# Patient Record
Sex: Female | Born: 1940 | Race: Black or African American | Hispanic: No | Marital: Single | Smoking: Never smoker
Health system: Southern US, Community
[De-identification: ages and names within clinical notes are randomized; demographics above are authoritative.]

## PROBLEM LIST (undated history)

## (undated) DIAGNOSIS — E7800 Pure hypercholesterolemia, unspecified: Secondary | ICD-10-CM

## (undated) DIAGNOSIS — Z9109 Other allergy status, other than to drugs and biological substances: Secondary | ICD-10-CM

## (undated) DIAGNOSIS — I10 Essential (primary) hypertension: Secondary | ICD-10-CM

## (undated) HISTORY — PX: TONSILLECTOMY: SUR1361

## (undated) HISTORY — PX: ABDOMINAL HYSTERECTOMY: SHX81

---

## 1998-08-30 ENCOUNTER — Other Ambulatory Visit: Admission: RE | Admit: 1998-08-30 | Discharge: 1998-08-30 | Payer: Self-pay | Admitting: Obstetrics & Gynecology

## 1999-09-08 ENCOUNTER — Other Ambulatory Visit: Admission: RE | Admit: 1999-09-08 | Discharge: 1999-09-08 | Payer: Self-pay | Admitting: Obstetrics & Gynecology

## 2002-04-18 ENCOUNTER — Encounter: Payer: Self-pay | Admitting: Internal Medicine

## 2002-04-18 ENCOUNTER — Encounter: Admission: RE | Admit: 2002-04-18 | Discharge: 2002-04-18 | Payer: Self-pay | Admitting: Internal Medicine

## 2002-04-24 ENCOUNTER — Encounter: Payer: Self-pay | Admitting: Internal Medicine

## 2002-04-24 ENCOUNTER — Encounter: Admission: RE | Admit: 2002-04-24 | Discharge: 2002-04-24 | Payer: Self-pay | Admitting: Internal Medicine

## 2002-04-25 ENCOUNTER — Encounter: Payer: Self-pay | Admitting: Internal Medicine

## 2002-04-25 ENCOUNTER — Encounter: Admission: RE | Admit: 2002-04-25 | Discharge: 2002-04-25 | Payer: Self-pay | Admitting: Internal Medicine

## 2002-05-21 ENCOUNTER — Encounter: Payer: Self-pay | Admitting: Internal Medicine

## 2002-05-21 ENCOUNTER — Ambulatory Visit (HOSPITAL_COMMUNITY): Admission: RE | Admit: 2002-05-21 | Discharge: 2002-05-21 | Payer: Self-pay | Admitting: Internal Medicine

## 2002-05-26 ENCOUNTER — Ambulatory Visit (HOSPITAL_COMMUNITY): Admission: RE | Admit: 2002-05-26 | Discharge: 2002-05-26 | Payer: Self-pay | Admitting: Gastroenterology

## 2002-05-26 ENCOUNTER — Encounter (INDEPENDENT_AMBULATORY_CARE_PROVIDER_SITE_OTHER): Payer: Self-pay

## 2002-05-29 ENCOUNTER — Ambulatory Visit (HOSPITAL_COMMUNITY): Admission: RE | Admit: 2002-05-29 | Discharge: 2002-05-29 | Payer: Self-pay | Admitting: Gastroenterology

## 2002-10-17 ENCOUNTER — Encounter: Admission: RE | Admit: 2002-10-17 | Discharge: 2002-10-17 | Payer: Self-pay | Admitting: Occupational Medicine

## 2002-10-17 ENCOUNTER — Encounter: Payer: Self-pay | Admitting: Occupational Medicine

## 2004-09-26 ENCOUNTER — Other Ambulatory Visit: Admission: RE | Admit: 2004-09-26 | Discharge: 2004-09-26 | Payer: Self-pay | Admitting: Obstetrics & Gynecology

## 2008-11-16 ENCOUNTER — Emergency Department (HOSPITAL_COMMUNITY): Admission: EM | Admit: 2008-11-16 | Discharge: 2008-11-16 | Payer: Self-pay | Admitting: Emergency Medicine

## 2008-12-09 ENCOUNTER — Encounter: Admission: RE | Admit: 2008-12-09 | Discharge: 2009-02-10 | Payer: Self-pay | Admitting: Internal Medicine

## 2009-06-07 ENCOUNTER — Encounter: Admission: RE | Admit: 2009-06-07 | Discharge: 2009-06-07 | Payer: Self-pay | Admitting: Internal Medicine

## 2009-06-14 ENCOUNTER — Encounter: Admission: RE | Admit: 2009-06-14 | Discharge: 2009-06-14 | Payer: Self-pay | Admitting: Obstetrics & Gynecology

## 2009-10-22 ENCOUNTER — Encounter: Admission: RE | Admit: 2009-10-22 | Discharge: 2009-10-22 | Payer: Self-pay | Admitting: Obstetrics & Gynecology

## 2010-07-22 NOTE — Op Note (Signed)
   NAME:  Deborah Stein, Deborah Stein                          ACCOUNT NO.:  [id number]   MEDICAL RECORD NO.:  [id number]                   PATIENT TYPE:  AMB   LOCATION:  ENDO                                 FACILITY:  [facility]   PHYSICIAN:  Danise Edge, M.D.                DATE OF BIRTH:  01/05/1941   DATE OF PROCEDURE:  05/26/2002  DATE OF DISCHARGE:                                 OPERATIVE REPORT   REFERRING PHYSICIAN:  Lilla Shook, M.D.   PROCEDURE:  Screening colonoscopy.   PROCEDURE INDICATION:  Ms. Deborah Stein is a 70 year old female, born  05-15-1940.  Ms. Deborah Stein is scheduled to undergo her first screening  colonoscopy with polypectomy to prevent colon cancer.  I discussed with her  the complications associated with colonoscopy and polypectomy including a 15  per 1000 risk of bleeding and 1 per 1000 risk of colon perforation requiring  surgery.  Ms. Deborah Stein has signed the operative permit.   ENDOSCOPIST:  Charolett Bumpers, M.D.   PREMEDICATION:  1. Demerol 50 mg.  2. Versed 5 mg.   ENDOSCOPE:  Olympus pediatric adjustable video colonoscope.   DESCRIPTION OF PROCEDURE:  After obtaining informed consent, Ms. Deborah Stein was  placed in the left lateral decubitus position.  I administered intravenous  Demerol and intravenous Versed to achieve conscious sedation for the  procedure.  The patient's blood pressure, oxygen saturation, and cardiac  rhythm were monitored throughout the procedure and documented in the medical  record.   Anal inspection was normal.  Digital rectal exam was normal.  The Olympus  pediatric colonoscope was introduced into the rectum and easily advanced to  the cecum.  Colonic preparation for the exam today was excellent.   RECTUM:  From the distal rectum, a 1 mm sessile polyp was removed with the  hot biopsy forceps.  SIGMOID COLON AND DESCENDING COLON:  Normal.  SPLENIC FLEXURE:  Normal.  TRANSVERSE COLON:  Normal.  HEPATIC FLEXURE:  Normal.  ASCENDING COLON:  Normal.  CECUM AND ILEOCECAL VALVE:  Normal.   ASSESSMENT:  From the distal rectum, a 1 mm sessile polyp was removed with  the hot biopsy forceps; otherwise normal screening proctocolonoscopy to the  cecum.    RECOMMENDATIONS:  If rectal polyp returns neoplastic pathologically, Ms.  Deborah Stein should undergo a repeat colonoscopy in five years.                                               Danise Edge, M.D.    MJ/MEDQ  D:  05/26/2002  T:  05/26/2002  Job:  [id number]   cc:   Lilla Shook, M.D.  [address], [address]  [place]  [place]  [address]  Fax: [phone]

## 2010-08-23 ENCOUNTER — Other Ambulatory Visit: Payer: Self-pay | Admitting: Internal Medicine

## 2010-08-23 DIAGNOSIS — Z1231 Encounter for screening mammogram for malignant neoplasm of breast: Secondary | ICD-10-CM

## 2010-10-26 ENCOUNTER — Ambulatory Visit
Admission: RE | Admit: 2010-10-26 | Discharge: 2010-10-26 | Disposition: A | Discharge status: Home / Self Care | Payer: Medicare Other | Source: Ambulatory Visit | Attending: Internal Medicine | Admitting: Internal Medicine

## 2010-10-26 ENCOUNTER — Other Ambulatory Visit: Payer: Self-pay | Admitting: Obstetrics & Gynecology

## 2010-10-26 DIAGNOSIS — Z1231 Encounter for screening mammogram for malignant neoplasm of breast: Secondary | ICD-10-CM

## 2011-09-05 ENCOUNTER — Other Ambulatory Visit: Payer: Self-pay | Admitting: Obstetrics & Gynecology

## 2011-09-05 DIAGNOSIS — N63 Unspecified lump in unspecified breast: Secondary | ICD-10-CM

## 2011-09-08 ENCOUNTER — Ambulatory Visit
Admission: RE | Admit: 2011-09-08 | Discharge: 2011-09-08 | Disposition: A | Discharge status: Home / Self Care | Payer: Medicare Other | Source: Ambulatory Visit | Attending: Obstetrics & Gynecology | Admitting: Obstetrics & Gynecology

## 2011-09-08 ENCOUNTER — Other Ambulatory Visit: Payer: Self-pay | Admitting: Obstetrics & Gynecology

## 2011-09-08 DIAGNOSIS — N63 Unspecified lump in unspecified breast: Secondary | ICD-10-CM

## 2011-09-08 DIAGNOSIS — Z1231 Encounter for screening mammogram for malignant neoplasm of breast: Secondary | ICD-10-CM

## 2011-10-23 ENCOUNTER — Ambulatory Visit: Payer: Medicare Other

## 2011-10-27 ENCOUNTER — Ambulatory Visit
Admission: RE | Admit: 2011-10-27 | Discharge: 2011-10-27 | Disposition: A | Discharge status: Home / Self Care | Payer: Medicare Other | Source: Ambulatory Visit | Attending: Obstetrics & Gynecology | Admitting: Obstetrics & Gynecology

## 2011-10-27 DIAGNOSIS — Z1231 Encounter for screening mammogram for malignant neoplasm of breast: Secondary | ICD-10-CM

## 2012-09-16 ENCOUNTER — Other Ambulatory Visit: Payer: Self-pay

## 2012-09-16 DIAGNOSIS — Z1231 Encounter for screening mammogram for malignant neoplasm of breast: Secondary | ICD-10-CM

## 2012-10-30 ENCOUNTER — Ambulatory Visit
Admission: RE | Admit: 2012-10-30 | Discharge: 2012-10-30 | Disposition: A | Discharge status: Home / Self Care | Payer: Medicare Other | Source: Ambulatory Visit

## 2012-10-30 DIAGNOSIS — Z1231 Encounter for screening mammogram for malignant neoplasm of breast: Secondary | ICD-10-CM

## 2013-08-15 ENCOUNTER — Other Ambulatory Visit: Payer: Self-pay

## 2013-08-15 DIAGNOSIS — Z1231 Encounter for screening mammogram for malignant neoplasm of breast: Secondary | ICD-10-CM

## 2013-10-31 ENCOUNTER — Encounter (INDEPENDENT_AMBULATORY_CARE_PROVIDER_SITE_OTHER): Payer: Self-pay

## 2013-10-31 ENCOUNTER — Ambulatory Visit
Admission: RE | Admit: 2013-10-31 | Discharge: 2013-10-31 | Disposition: A | Discharge status: Home / Self Care | Payer: Medicare Other | Source: Ambulatory Visit

## 2013-10-31 DIAGNOSIS — Z1231 Encounter for screening mammogram for malignant neoplasm of breast: Secondary | ICD-10-CM

## 2014-09-30 ENCOUNTER — Other Ambulatory Visit: Payer: Self-pay

## 2014-09-30 DIAGNOSIS — Z1231 Encounter for screening mammogram for malignant neoplasm of breast: Secondary | ICD-10-CM

## 2014-11-06 ENCOUNTER — Ambulatory Visit
Admission: RE | Admit: 2014-11-06 | Discharge: 2014-11-06 | Disposition: A | Discharge status: Home / Self Care | Payer: Medicare PPO | Source: Ambulatory Visit

## 2014-11-06 DIAGNOSIS — Z1231 Encounter for screening mammogram for malignant neoplasm of breast: Secondary | ICD-10-CM

## 2014-11-10 DIAGNOSIS — Z01419 Encounter for gynecological examination (general) (routine) without abnormal findings: Secondary | ICD-10-CM | POA: Diagnosis not present

## 2014-11-10 DIAGNOSIS — Z6826 Body mass index (BMI) 26.0-26.9, adult: Secondary | ICD-10-CM | POA: Diagnosis not present

## 2015-01-06 DIAGNOSIS — D3001 Benign neoplasm of right kidney: Secondary | ICD-10-CM | POA: Diagnosis not present

## 2015-01-06 DIAGNOSIS — R3121 Asymptomatic microscopic hematuria: Secondary | ICD-10-CM | POA: Diagnosis not present

## 2015-09-16 ENCOUNTER — Other Ambulatory Visit: Payer: Self-pay | Admitting: Obstetrics & Gynecology

## 2015-09-16 DIAGNOSIS — Z1231 Encounter for screening mammogram for malignant neoplasm of breast: Secondary | ICD-10-CM

## 2015-11-12 ENCOUNTER — Ambulatory Visit
Admission: RE | Admit: 2015-11-12 | Discharge: 2015-11-12 | Disposition: A | Discharge status: Home / Self Care | Payer: Medicare Other | Source: Ambulatory Visit | Attending: Obstetrics & Gynecology | Admitting: Obstetrics & Gynecology

## 2015-11-12 DIAGNOSIS — Z1231 Encounter for screening mammogram for malignant neoplasm of breast: Secondary | ICD-10-CM

## 2015-12-18 ENCOUNTER — Encounter (HOSPITAL_COMMUNITY): Payer: Self-pay | Admitting: *Deleted

## 2015-12-18 ENCOUNTER — Emergency Department (HOSPITAL_COMMUNITY)
Admission: EM | Admit: 2015-12-18 | Discharge: 2015-12-18 | Disposition: A | Discharge status: Home / Self Care | Payer: Medicare Other | Attending: Emergency Medicine | Admitting: Emergency Medicine

## 2015-12-18 ENCOUNTER — Emergency Department (HOSPITAL_COMMUNITY): Payer: Medicare Other

## 2015-12-18 DIAGNOSIS — M5416 Radiculopathy, lumbar region: Secondary | ICD-10-CM | POA: Diagnosis not present

## 2015-12-18 DIAGNOSIS — Z79899 Other long term (current) drug therapy: Secondary | ICD-10-CM | POA: Diagnosis not present

## 2015-12-18 DIAGNOSIS — Y939 Activity, unspecified: Secondary | ICD-10-CM | POA: Diagnosis not present

## 2015-12-18 DIAGNOSIS — M5412 Radiculopathy, cervical region: Secondary | ICD-10-CM | POA: Insufficient documentation

## 2015-12-18 DIAGNOSIS — Y999 Unspecified external cause status: Secondary | ICD-10-CM | POA: Insufficient documentation

## 2015-12-18 DIAGNOSIS — M542 Cervicalgia: Secondary | ICD-10-CM | POA: Diagnosis present

## 2015-12-18 DIAGNOSIS — Y92410 Unspecified street and highway as the place of occurrence of the external cause: Secondary | ICD-10-CM | POA: Insufficient documentation

## 2015-12-18 DIAGNOSIS — I10 Essential (primary) hypertension: Secondary | ICD-10-CM | POA: Insufficient documentation

## 2015-12-18 HISTORY — DX: Other allergy status, other than to drugs and biological substances: Z91.09

## 2015-12-18 HISTORY — DX: Pure hypercholesterolemia, unspecified: E78.00

## 2015-12-18 HISTORY — DX: Essential (primary) hypertension: I10

## 2015-12-18 MED ORDER — DEXAMETHASONE SODIUM PHOSPHATE 10 MG/ML IJ SOLN
10.0000 mg | Freq: Once | INTRAMUSCULAR | Status: AC
  Administered 2015-12-18: 10 mg via INTRAMUSCULAR
  Filled 2015-12-18: qty 1

## 2015-12-18 MED ORDER — HYDROCODONE-ACETAMINOPHEN 5-325 MG PO TABS: 1.0000 | ORAL_TABLET | ORAL | 0 refills | Status: AC | PRN

## 2015-12-18 MED ORDER — KETOROLAC TROMETHAMINE 30 MG/ML IJ SOLN
30.0000 mg | Freq: Once | INTRAMUSCULAR | Status: AC
  Administered 2015-12-18: 30 mg via INTRAMUSCULAR
  Filled 2015-12-18: qty 1

## 2015-12-18 MED ORDER — PREDNISONE 10 MG (21) PO TBPK: 10.0000 mg | ORAL_TABLET | Freq: Every day | ORAL | 0 refills | Status: AC

## 2015-12-18 MED ORDER — HYDROCODONE-ACETAMINOPHEN 5-325 MG PO TABS
1.0000 | ORAL_TABLET | Freq: Once | ORAL | Status: AC
  Administered 2015-12-18: 1 via ORAL
  Filled 2015-12-18: qty 1
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
## 2015-12-29 ENCOUNTER — Ambulatory Visit: Payer: Medicare Other | Attending: Internal Medicine

## 2015-12-29 DIAGNOSIS — M545 Low back pain, unspecified: Secondary | ICD-10-CM

## 2015-12-29 DIAGNOSIS — M25512 Pain in left shoulder: Secondary | ICD-10-CM

## 2015-12-29 DIAGNOSIS — M542 Cervicalgia: Secondary | ICD-10-CM | POA: Insufficient documentation

## 2015-12-29 DIAGNOSIS — R262 Difficulty in walking, not elsewhere classified: Secondary | ICD-10-CM

## 2015-12-29 DIAGNOSIS — M79605 Pain in left leg: Secondary | ICD-10-CM | POA: Insufficient documentation

## 2015-12-29 NOTE — Patient Instructions (Signed)
From cabinet scapula retraction and knee to chest, R and LT 10-30 sec stretch 2-3x/day
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
## 2016-01-04 ENCOUNTER — Ambulatory Visit: Payer: Medicare Other

## 2016-01-04 DIAGNOSIS — R262 Difficulty in walking, not elsewhere classified: Secondary | ICD-10-CM

## 2016-01-04 DIAGNOSIS — M79605 Pain in left leg: Secondary | ICD-10-CM

## 2016-01-04 DIAGNOSIS — M542 Cervicalgia: Secondary | ICD-10-CM

## 2016-01-04 DIAGNOSIS — M25512 Pain in left shoulder: Secondary | ICD-10-CM

## 2016-01-04 DIAGNOSIS — M545 Low back pain, unspecified: Secondary | ICD-10-CM

## 2016-01-04 NOTE — Therapy (Signed)
[facility] [address] [place], [place], [address] Phone: [phone]   Fax:  [phone]  Physical Therapy Treatment  Patient Details  Name: Deborah Stein MRN: [id number] Date of Birth: February 12, 1941 Referring Provider:  Kirby FunkJohn Griffin , MD  Encounter Date: 01/04/2016      PT End of Session - 01/04/16 0845    Visit Number 2   Number of Visits 12   Date for PT Re-Evaluation 02/11/16   Authorization Type UHR MCR   PT Start Time 0845   PT Stop Time 0945   PT Time Calculation (min) 60 min   Activity Tolerance Patient tolerated treatment well;No increased pain   Behavior During Therapy WFL for tasks assessed/performed      Past Medical History:  Diagnosis Date  . Environmental allergies   . Hypercholesteremia   . Hypertension     Past Surgical History:  Procedure Laterality Date  . ABDOMINAL HYSTERECTOMY    . TONSILLECTOMY      There were no vitals filed for this visit.      Subjective Assessment - 01/04/16 0845    Subjective She reports some days are better. Thinks cold weather may be a factor in bad days.    Currently in Pain? Yes   Pain Score --  7.5   Pain Location Back   Pain Orientation Left;Posterior   Pain Descriptors / Indicators Nagging   Pain Type Acute pain   Pain Onset 1 to 4 weeks ago   Pain Frequency Constant   Aggravating Factors  sit   Pain Relieving Factors change postion   Pain Score --  7.5   Pain Location Neck   Pain Orientation Left;Posterior   Pain Type Acute pain   Pain Onset 1 to 4 weeks ago   Pain Frequency Constant   Aggravating Factors  turning Lt    Pain Relieving Factors sit erect                         [facility] Adult PT Treatment/Exercise - 01/04/16 0908      Modalities   Modalities Ultrasound;Electrical Stimulation;Moist Heat     Moist Heat Therapy   Number Minutes Moist Heat 15 Minutes   Moist Heat Location Lumbar Spine;Cervical     Ultrasound   Ultrasound Location LT neck/trap   Ultrasound Parameters 100% 1 MHz 1.5 Wcm2   Ultrasound Goals Pain     Manual Therapy   Manual Therapy Joint mobilization;Soft tissue mobilization   Joint Mobilization PA gides  Gr 2-3 C2-7   Soft tissue mobilization paraspinals/traps Lt neck                PT Education - 01/04/16 0939    Education provided Yes   Education Details management of hand pain diue to OA , heat versus cold, tools to ease pressure to hands/joints, tilt and neck stretches   Person(s) Educated Patient   Methods Explanation   Comprehension Verbalized understanding          PT Short Term Goals - 12/29/15 1016      PT SHORT TERM GOAL #1   Title she will be independent with inital HEP   Time 3   Period Weeks   Status New     PT SHORT TERM GOAL #2   Title She will report pain in Lt neck decr 40%   Time 3   Period Weeks   Status New     PT  SHORT TERM GOAL #3   Title She will report pain in back decr 40%   Time 3   Period Weeks   Status New     PT SHORT TERM GOAL #4   Title She will report no LT arm symptoms   Time 3   Period Weeks   Status New     PT SHORT TERM GOAL #5   Title She will be able to   sit and stand for 20 min or more without increased pain   Time 3   Period Weeks   Status New           PT Long Term Goals - 12/29/15 1132      PT LONG TERM GOAL #1   Title She will be independent with all HEP issued   Time 6     PT LONG TERM GOAL #2   Title She will report 75% or more decreased neck pain and will be able to look over shoulder with  no pain.   Time 6   Period Weeks   Status New     PT LONG TERM GOAL #3   Title She will report no back pain with home activties and will able to clean bathroom without pain more than 1-2/10   Time 6   Period Weeks   Status New     PT LONG TERM GOAL #4   Title She will be able to mop with 1-2/10 neck and back pain   Time 6   Period Weeks   Status New     PT LONG TERM GOAL #5   Title she  ,with support, will be able to sit as needed for activity with 1-2/10 max pain   Time 6   Period Weeks   Status New     Additional Long Term Goals   Additional Long Term Goals Yes     PT LONG TERM GOAL #6   Title She will report able to do normal shopping with 1-2 max pain   Time 6   Period Weeks   Status New               Plan - 01/04/16 0844    Clinical Impression Statement She is   improved with some days feeling better and some not. She was observed to flex fully to take items off floor and hold for 10 seconds and rise smoothly without indication of pain. She appears in  no distress but does have pain at end range stretch and tingle into LT hand intermittantly   PT Treatment/Interventions Electrical Stimulation;Moist Heat;Traction;Ultrasound;Passive range of motion;Patient/family education;Taping;Manual techniques;Therapeutic exercise;Dry needling   PT Next Visit Plan modalities STW, Stretching , add to HEP   PT Home Exercise Plan scapula retraction, knee to chest, neck rotation nad sidebend stretch, posterior pelvic tilt   Consulted and Agree with Plan of Care Patient      Patient will benefit from skilled therapeutic intervention in order to improve the following deficits and impairments:  Pain, Increased muscle spasms, Decreased activity tolerance, Decreased range of motion  Visit Diagnosis: Cervicalgia  Acute bilateral low back pain without sciatica  Acute pain of left shoulder  Pain in left leg  Difficulty in walking, not elsewhere classified     Problem List There are no active problems to display for this patient.   Caprice RedChasse, Mozell Haber M  PT 01/04/2016, 9:46 AM  [facility] [address] [place], [place], [address] Phone: [phone]  Fax:  [phone]  Name: Deborah Stein MRN: [id number] Date of Birth: [birth year]

## 2016-01-04 NOTE — Patient Instructions (Signed)
From cabinet side bend and rotation(levator ) stretch and posterior pelvic tilt ( hold 10 sec 10 reps ) and stretch 10-30 sec x 2 RT and LT 2x/day

## 2016-01-06 ENCOUNTER — Ambulatory Visit: Payer: Medicare Other | Attending: Internal Medicine | Admitting: Physical Therapy

## 2016-01-06 DIAGNOSIS — M545 Low back pain, unspecified: Secondary | ICD-10-CM

## 2016-01-06 DIAGNOSIS — M79605 Pain in left leg: Secondary | ICD-10-CM | POA: Insufficient documentation

## 2016-01-06 DIAGNOSIS — M542 Cervicalgia: Secondary | ICD-10-CM | POA: Diagnosis not present

## 2016-01-06 DIAGNOSIS — M25512 Pain in left shoulder: Secondary | ICD-10-CM | POA: Diagnosis present

## 2016-01-06 DIAGNOSIS — R262 Difficulty in walking, not elsewhere classified: Secondary | ICD-10-CM | POA: Diagnosis present

## 2016-01-06 NOTE — Therapy (Signed)
[facility] [address] [place], [place], [address] Phone: [phone]   Fax:  [phone]  Physical Therapy Treatment  Patient Details  Name: Deborah HaloDiane M Stein MRN: [id number] Date of Birth: December 16, 1940 Referring Provider:  Kirby FunkJohn Griffin , MD  Encounter Date: 01/06/2016      PT End of Session - 01/06/16 1554    Visit Number 3   Number of Visits 12   Date for PT Re-Evaluation 02/11/16   PT Start Time 1505   PT Stop Time 1605   PT Time Calculation (min) 60 min   Activity Tolerance Patient tolerated treatment well   Behavior [facility] Therapy [facility] for [facility] assessed/performed      Past Medical History:  Diagnosis Date  . Environmental allergies   . Hypercholesteremia   . Hypertension     Past Surgical History:  Procedure Laterality Date  . ABDOMINAL HYSTERECTOMY    . TONSILLECTOMY      There were no vitals filed for this visit.      Subjective Assessment - 01/06/16 1509    Subjective She woke up with anxiety attack.  Pain is worse today Today is a little more pain  i have been looking for a car.   Currently in Pain? Yes   Pain Score 8    Pain Location Back   Pain Orientation Right;Posterior   Pain Descriptors / Indicators --  needle like pain   Pain Frequency Constant   Aggravating Factors  sittinng   Pain Relieving Factors change of position   Pain Score 8   Pain Location Neck   Pain Orientation Left;Posterior   Pain Descriptors / Indicators Sore;Shooting   Pain Type Acute pain   Pain Radiating Towards leleft thumb   Pain Frequency Constant   Aggravating Factors  sleeping on her left side.     Pain Relieving Factors change of position.                         [facility] Adult PT Treatment/Exercise - 01/06/16 0001      Self-Care   Self-Care --  Posture sitting,  ADL handout lumbar support     Neck Exercises: Machines for Strengthening   UBE (Upper Arm Bike) Nu step L4, X 10 minutes     Neck Exercises: Seated   Postural Training cues, droppinf shoulder s down and back 5 X 2     Neck Exercises: Supine   Cervical Isometrics 5 reps   Cervical Isometrics Limitations heavy cues   Other Supine Exercise shoulder press,   Other Supine Exercise both shoulder ER with yellow bands, added to HEP     Lumbar Exercises: Stretches   Double Knee to Chest Stretch Limitations 10 x legs on ball   Lower Trunk Rotation Limitations 10 X  with ball                PT Education - 01/06/16 1554    Education provided Yes   Education Details ADL,  Supine ER with yellow band   Person(s) Educated Patient   Methods Explanation;Demonstration   Comprehension Verbalized understanding;Returned demonstration;Need further instruction          PT Short Term Goals - 01/06/16 1558      PT SHORT TERM GOAL #1   Title she will be independent with inital HEP   Baseline cues   Time 3   Period Weeks   Status On-going     PT SHORT TERM  GOAL #2   Title She will report pain in Lt neck decr 40%   Baseline pain a little worse today from stress of shoppoing for a new car.   Time 3   Period Weeks   Status On-going     PT SHORT TERM GOAL #3   Title She will report pain in back decr 40%   Time 3   Period Weeks   Status On-going     PT SHORT TERM GOAL #4   Title She will report no LT arm symptoms   Time 3   Period Weeks   Status On-going     PT SHORT TERM GOAL #5   Title She will be able to   sit and stand for 20 min or more without increased pain   Time 3   Period Weeks   Status Unable to assess           PT Long Term Goals - 12/29/15 1132      PT LONG TERM GOAL #1   Title She will be independent with all HEP issued   Time 6     PT LONG TERM GOAL #2   Title She will report 75% or more decreased neck pain and will be able to look over shoulder with  no pain.   Time 6   Period Weeks   Status New     PT LONG TERM GOAL #3   Title She will report no back pain with home  activties and will able to clean bathroom without pain more than 1-2/10   Time 6   Period Weeks   Status New     PT LONG TERM GOAL #4   Title She will be able to mop with 1-2/10 neck and back pain   Time 6   Period Weeks   Status New     PT LONG TERM GOAL #5   Title she ,with support, will be able to sit as needed for activity with 1-2/10 max pain   Time 6   Period Weeks   Status New     Additional Long Term Goals   Additional Long Term Goals Yes     PT LONG TERM GOAL #6   Title She will report able to do normal shopping with 1-2 max pain   Time 6   Period Weeks   Status New               Plan - 01/06/16 1555    Clinical Impression Statement She has not had a headach since her last visit.  Patient felt more relaxed after exercising today with less pain. progress toward home exercise goal.     PT Next Visit Plan modalities STW, Stretching , add to HEP   PT Home Exercise Plan scapula retraction, knee to chest, neck rotation nad sidebend stretch, posterior pelvic tilt 911/04/2015)  supine ER, yellow band   Consulted and Agree with Plan of Care Patient      Patient will benefit from skilled therapeutic intervention in order to improve the following deficits and impairments:  Pain, Increased muscle spasms, Decreased activity tolerance, Decreased range of motion  Visit Diagnosis: Cervicalgia  Acute bilateral low back pain without sciatica  Acute pain of left shoulder  Pain in left leg  Difficulty in walking, not elsewhere classified     Problem List There are no active problems to display for this patient.   Trevyon Swor  PTA 01/06/2016, 4:03 PM  [facility] [address] [place], [place], [address] Phone: [phone]   Fax:  [phone]  Name: Deborah Stein MRN: [id number] Date of Birth: 1940/04/21

## 2016-01-06 NOTE — Therapy (Signed)
[facility] [address] [place], [place], [address] Phone: [phone]   Fax:  [phone]  Physical Therapy Treatment  Patient Details  Name: Deborah Stein MRN: [id number] Date of Birth: [birth year] Referring Provider:  Kirby FunkJohn Griffin , MD  Encounter Date: 01/06/2016      PT End of Session - 01/06/16 1554    Visit Number 3   Number of Visits 12   Date for PT Re-Evaluation 02/11/16   PT Start Time 1505   PT Stop Time 1605   PT Time Calculation (min) 60 min   Activity Tolerance Patient tolerated treatment well   Behavior During Therapy [facility] for tasks assessed/performed      Past Medical History:  Diagnosis Date  . Environmental allergies   . Hypercholesteremia   . Hypertension     Past Surgical History:  Procedure Laterality Date  . ABDOMINAL HYSTERECTOMY    . TONSILLECTOMY      There were no vitals filed for this visit.      Subjective Assessment - 01/06/16 1509    Subjective She woke up with anxiety attack.  Pain is worse today Today is a little more pain  i have been looking for a car.   Currently in Pain? Yes   Pain Score 8    Pain Location Back   Pain Orientation Right;Posterior   Pain Descriptors / Indicators --  needle like pain   Pain Frequency Constant   Aggravating Factors  sittinng   Pain Relieving Factors change of position   Pain Score 8   Pain Location Neck   Pain Orientation Left;Posterior   Pain Descriptors / Indicators Sore;Shooting   Pain Type Acute pain   Pain Radiating Towards leleft thumb   Pain Frequency Constant   Aggravating Factors  sleeping on her left side.     Pain Relieving Factors change of position.                         [facility] Adult PT Treatment/Exercise - 01/06/16 0001      Self-Care   Self-Care --  Posture sitting,  ADL handout lumbar support     Neck Exercises: Machines for Strengthening   UBE (Upper Arm Bike) Nu step L4, X 10 minutes     Neck Exercises: Seated   Postural Training cues, droppinf shoulder s down and back 5 X 2     Neck Exercises: Supine   Cervical Isometrics 5 reps   Cervical Isometrics Limitations heavy cues   Other Supine Exercise shoulder press,   Other Supine Exercise both shoulder ER with yellow bands, added to HEP     Lumbar Exercises: Stretches   Double Knee to Chest Stretch Limitations 10 x legs on ball   Lower Trunk Rotation Limitations 10 X  with ball     Moist Heat Therapy   Number Minutes Moist Heat 15 Minutes   Moist Heat Location Lumbar Spine;Cervical  extra ;layer needed cervical.                PT Education - 01/06/16 1554    Education provided Yes   Education Details ADL,  Supine ER with yellow band   Person(s) Educated Patient   Methods Explanation;Demonstration   Comprehension Verbalized understanding;Returned demonstration;Need further instruction          PT Short Term Goals - 01/06/16 1558      PT SHORT TERM GOAL #1   Title she will be  independent with inital HEP   Baseline cues   Time 3   Period Weeks   Status On-going     PT SHORT TERM GOAL #2   Title She will report pain in Lt neck decr 40%   Baseline pain a little worse today from stress of shoppoing for a new car.   Time 3   Period Weeks   Status On-going     PT SHORT TERM GOAL #3   Title She will report pain in back decr 40%   Time 3   Period Weeks   Status On-going     PT SHORT TERM GOAL #4   Title She will report no LT arm symptoms   Time 3   Period Weeks   Status On-going     PT SHORT TERM GOAL #5   Title She will be able to   sit and stand for 20 min or more without increased pain   Time 3   Period Weeks   Status Unable to assess           PT Long Term Goals - 12/29/15 1132      PT LONG TERM GOAL #1   Title She will be independent with all HEP issued   Time 6     PT LONG TERM GOAL #2   Title She will report 75% or more decreased neck pain and will be able to look over  shoulder with  no pain.   Time 6   Period Weeks   Status New     PT LONG TERM GOAL #3   Title She will report no back pain with home activties and will able to clean bathroom without pain more than 1-2/10   Time 6   Period Weeks   Status New     PT LONG TERM GOAL #4   Title She will be able to mop with 1-2/10 neck and back pain   Time 6   Period Weeks   Status New     PT LONG TERM GOAL #5   Title she ,with support, will be able to sit as needed for activity with 1-2/10 max pain   Time 6   Period Weeks   Status New     Additional Long Term Goals   Additional Long Term Goals Yes     PT LONG TERM GOAL #6   Title She will report able to do normal shopping with 1-2 max pain   Time 6   Period Weeks   Status New               Plan - 01/06/16 1555    Clinical Impression Statement She has not had a headach since her last visit.  Patient felt more relaxed after exercising today with less pain. progress toward home exercise goal.     PT Next Visit Plan modalities STW, Stretching , add to HEP   PT Home Exercise Plan scapula retraction, knee to chest, neck rotation nad sidebend stretch, posterior pelvic tilt 911/04/2015)  supine ER, yellow band   Consulted and Agree with Plan of Care Patient      Patient will benefit from skilled therapeutic intervention in order to improve the following deficits and impairments:  Pain, Increased muscle spasms, Decreased activity tolerance, Decreased range of motion  Visit Diagnosis: Cervicalgia  Acute bilateral low back pain without sciatica  Acute pain of left shoulder  Pain in left leg  Difficulty in walking, not elsewhere classified  Problem List There are no active problems to display for this patient.   Lether Tesch PTA 01/06/2016, 4:07 PM  [facility] [address] [place], [place], [address] Phone: [phone]   Fax:  [phone]  Name: Deborah Stein MRN:  [id number] Date of Birth: 02/13/1941

## 2016-01-06 NOTE — Patient Instructions (Addendum)
_       Shoulder Rotation: Double Arm   On back, knees bent, feet flat, elbows tucked at sides, bent 90, hands palms up. Pull hands apart and down toward floor, keeping elbows near sides. Hold momentarily. Slowly return to starting position. Repeat _10__ times. Band color ___yellow___   Sleeping on Back  Place pillow under knees. A pillow with cervical support and a roll around waist are also helpful. Copyright  VHI. All rights reserved.  Sleeping on Side Place pillow between knees. Use cervical support under neck and a roll around waist as needed. Copyright  VHI. All rights reserved.   Sleeping on Stomach   If this is the only desirable sleeping position, place pillow under lower legs, and under stomach or chest as needed.  Posture - Sitting   Sit upright, head facing forward. Try using a roll to support lower back. Keep shoulders relaxed, and avoid rounded back. Keep hips level with knees. Avoid crossing legs for long periods. Stand to Sit / Sit to Stand   To sit: Bend knees to lower self onto front edge of chair, then scoot back on seat. To stand: Reverse sequence by placing one foot forward, and scoot to front of seat. Use rocking motion to stand up.   Work Height and Reach  Ideal work height is no more than 2 to 4 inches below elbow level when standing, and at elbow level when sitting. Reaching should be limited to arm's length, with elbows slightly bent.  Bending  Bend at hips and knees, not back. Keep feet shoulder-width apart.    Posture - Standing   Good posture is important. Avoid slouching and forward head thrust. Maintain curve in low back and align ears over shoul- ders, hips over ankles.  Alternating Positions   Alternate tasks and change positions frequently to reduce fatigue and muscle tension. Take rest breaks. Computer Work   Position work to Art gallery managerface forward. Use proper work and seat height. Keep shoulders back and down, wrists straight,  and elbows at right angles. Use chair that provides full back support. Add footrest and lumbar roll as needed.  Getting Into / Out of Car  Lower self onto seat, scoot back, then bring in one leg at a time. Reverse sequence to get out.  Dressing  Lie on back to pull socks or slacks over feet, or sit and bend leg while keeping back straight.    Housework - Sink  Place one foot on ledge of cabinet under sink when standing at sink for prolonged periods.   Pushing / Pulling  Pushing is preferable to pulling. Keep back in proper alignment, and use leg muscles to do the work.  Deep Squat   Squat and lift with both arms held against upper trunk. Tighten stomach muscles without holding breath. Use smooth movements to avoid jerking.  Avoid Twisting   Avoid twisting or bending back. Pivot around using foot movements, and bend at knees if needed when reaching for articles.  Carrying Luggage   Distribute weight evenly on both sides. Use a cart whenever possible. Do not twist trunk. Move body as a unit.   Lifting Principles .Maintain proper posture and head alignment. .Slide object as close as possible before lifting. .Move obstacles out of the way. .Test before lifting; ask for help if too heavy. .Tighten stomach muscles without holding breath. .Use smooth movements; do not jerk. .Use legs to do the work, and pivot with feet. .Distribute the work load symmetrically  and close to the center of trunk. .Push instead of pull whenever possible.   Ask For Help   Ask for help and delegate to others when possible. Coordinate your movements when lifting together, and maintain the low back curve.  Log Roll   Lying on back, bend left knee and place left arm across chest. Roll all in one movement to the right. Reverse to roll to the left. Always move as one unit. Housework - Sweeping  Use long-handled equipment to avoid stooping.   Housework - Wiping  Position yourself as close  as possible to reach work surface. Avoid straining your back.  Laundry - Unloading Wash   To unload small items at bottom of washer, lift leg opposite to arm being used to reach.  Gardening - Raking  Move close to area to be raked. Use arm movements to do the work. Keep back straight and avoid twisting.     Cart  When reaching into cart with one arm, lift opposite leg to keep back straight.   Getting Into / Out of Bed  Lower self to lie down on one side by raising legs and lowering head at the same time. Use arms to assist moving without twisting. Bend both knees to roll onto back if desired. To sit up, start from lying on side, and use same move-ments in reverse. Housework - Vacuuming  Hold the vacuum with arm held at side. Step back and forth to move it, keeping head up. Avoid twisting.   Laundry - Armed forces training and education officerLoading Wash  Position laundry basket so that bending and twisting can be avoided.   Laundry - Unloading Dryer  Squat down to reach into clothes dryer or use a reacher.  Gardening - Weeding / Psychiatric nurselanting  Squat or Kneel. Knee pads may be helpful.

## 2016-01-11 ENCOUNTER — Ambulatory Visit: Payer: Medicare Other | Admitting: Physical Therapy

## 2016-01-11 DIAGNOSIS — M542 Cervicalgia: Secondary | ICD-10-CM | POA: Diagnosis not present

## 2016-01-11 DIAGNOSIS — R262 Difficulty in walking, not elsewhere classified: Secondary | ICD-10-CM

## 2016-01-11 DIAGNOSIS — M79605 Pain in left leg: Secondary | ICD-10-CM

## 2016-01-11 DIAGNOSIS — M25512 Pain in left shoulder: Secondary | ICD-10-CM

## 2016-01-11 DIAGNOSIS — M545 Low back pain, unspecified: Secondary | ICD-10-CM

## 2016-01-11 NOTE — Therapy (Signed)
[place], [place], [address] Phone: [phone]   Fax:  [phone]  Physical Therapy Treatment  Patient Details  Name: Deborah Stein MRN: [id number] Date of Birth: 1940-07-18 Referring Provider:  Lavone Orn , MD  Encounter Date: 01/11/2016      PT End of Session - 01/11/16 1803    Visit Number 4   Number of Visits 12   Date for PT Re-Evaluation 02/11/16   PT Start Time 0933   PT Stop Time 1030   PT Time Calculation (min) 57 min   Behavior During Therapy [facility] for tasks assessed/performed      Past Medical History:  Diagnosis Date  . Environmental allergies   . Hypercholesteremia   . Hypertension     Past Surgical History:  Procedure Laterality Date  . ABDOMINAL HYSTERECTOMY    . TONSILLECTOMY      There were no vitals filed for this visit.      Subjective Assessment - 01/11/16 0929    Subjective My neck does not hurt as bad in my Neck.  Pain did not go down to my toe yesterday , it stuck in my calf. Pain .  I have been trying to use good body mechanics.   Currently in Pain? Yes   Pain Score --  mild now,  up to 10/10   Pain Location Back   Pain Orientation Right;Posterior   Pain Descriptors / Indicators Shooting;Spasm   Pain Radiating Towards left calf   Aggravating Factors  wakes up un pain   Pain Relieving Factors change of position   Multiple Pain Sites Yes   Pain Score 5  yesterday it was a 10/10   Pain Orientation Left;Posterior   Pain Descriptors / Indicators --  very painful, pulling   Pain Type Acute pain   Pain Frequency Constant   Aggravating Factors  turning her head   Pain Relieving Factors good posture, body mechanics.                         [facility] Adult PT Treatment/Exercise - 01/11/16 0001      Neck Exercises: Supine   Neck Retraction 5 reps  2 sets   Neck Retraction Limitations 1 set with fist squeeze, 1 set with tongue press with eyes  looking overhead.   Other Supine Exercise shoulder press,  10 X   Other Supine Exercise Shoulder ER with yellow band cues initially 10 x     Lumbar Exercises: Stretches   Passive Hamstring Stretch 3 reps;30 seconds   Passive Hamstring Stretch Limitations both   Double Knee to Chest Stretch Limitations 10 x legs on ball   Lower Trunk Rotation Limitations 10 X  5 second holds     Lumbar Exercises: Supine   Bridge 10 reps   Bridge Limitations legs on ball     Moist Heat Therapy   Number Minutes Moist Heat 15 Minutes   Moist Heat Location Lumbar Spine;Cervical  extra layer cervical                  PT Short Term Goals - 01/11/16 1007      PT SHORT TERM GOAL #1   Title she will be independent with inital HEP   Baseline cues, Not yet doing consistantly   Time 3   Period Weeks   Status On-going     PT SHORT TERM GOAL #2   Title She will report  pain in Lt neck decr 40%   Baseline 50% not yet consistant   Time 3   Period Weeks   Status Partially Met     PT SHORT TERM GOAL #3   Title She will report pain in back decr 40%   Time 3   Period Weeks   Status On-going     PT SHORT TERM GOAL #4   Title She will report no LT arm symptoms   Baseline continues   Time 3   Period Weeks   Status On-going     PT SHORT TERM GOAL #5   Title She will be able to   sit and stand for 20 min or more without increased pain   Time 3   Period Weeks   Status Unable to assess           PT Long Term Goals - 12/29/15 1132      PT LONG TERM GOAL #1   Title She will be independent with all HEP issued   Time 6     PT LONG TERM GOAL #2   Title She will report 75% or more decreased neck pain and will be able to look over shoulder with  no pain.   Time 6   Period Weeks   Status New     PT LONG TERM GOAL #3   Title She will report no back pain with home activties and will able to clean bathroom without pain more than 1-2/10   Time 6   Period Weeks   Status New     PT  LONG TERM GOAL #4   Title She will be able to mop with 1-2/10 neck and back pain   Time 6   Period Weeks   Status New     PT LONG TERM GOAL #5   Title she ,with support, will be able to sit as needed for activity with 1-2/10 max pain   Time 6   Period Weeks   Status New     Additional Long Term Goals   Additional Long Term Goals Yes     PT LONG TERM GOAL #6   Title She will report able to do normal shopping with 1-2 max pain   Time 6   Period Weeks   Status New               Plan - 01/11/16 1804    Clinical Impression Statement Stabilization focus today.  Pain is beginning to centralize going to calf ve toe.  No new goals met or HEP added today.  Pain not increased at end of session.  Intermittant pain with some exercises during session.    PT Next Visit Plan modalities STW, Stretching , add to HEP   PT Home Exercise Plan scapula retraction, knee to chest, neck rotation nad sidebend stretch, posterior pelvic tilt 911/04/2015)  supine ER, yellow band   Consulted and Agree with Plan of Care Patient      Patient will benefit from skilled therapeutic intervention in order to improve the following deficits and impairments:  Pain, Increased muscle spasms, Decreased activity tolerance, Decreased range of motion  Visit Diagnosis: Cervicalgia  Acute bilateral low back pain without sciatica  Acute pain of left shoulder  Pain in left leg  Difficulty in walking, not elsewhere classified     Problem List There are no active problems to display for this patient.   HARRIS,KAREN PTA 01/11/2016, 6:06 PM  [facility]  [place], [place], [address] Phone: [phone]   Fax:  [phone]  Name: MILTON STREICHER MRN: [id number] Date of Birth: 1941-01-09

## 2016-01-13 ENCOUNTER — Ambulatory Visit: Payer: Medicare Other | Admitting: Physical Therapy

## 2016-01-13 DIAGNOSIS — M545 Low back pain, unspecified: Secondary | ICD-10-CM

## 2016-01-13 DIAGNOSIS — M25512 Pain in left shoulder: Secondary | ICD-10-CM

## 2016-01-13 DIAGNOSIS — M542 Cervicalgia: Secondary | ICD-10-CM

## 2016-01-13 DIAGNOSIS — R262 Difficulty in walking, not elsewhere classified: Secondary | ICD-10-CM

## 2016-01-13 DIAGNOSIS — M79605 Pain in left leg: Secondary | ICD-10-CM

## 2016-01-13 NOTE — Therapy (Signed)
[place], [place], [address] Phone: [phone]   Fax:  [phone]  Physical Therapy Treatment  Patient Details  Name: Deborah Stein MRN: [id number] Date of Birth: [birth year] Referring Provider:  Lavone Orn , MD  Encounter Date: 01/13/2016      PT End of Session - 01/13/16 0855    Visit Number 5   Number of Visits 12   Date for PT Re-Evaluation 02/11/16   Authorization Type UHR MCR   PT Start Time 0845   PT Stop Time 0945   PT Time Calculation (min) 60 min      Past Medical History:  Diagnosis Date  . Environmental allergies   . Hypercholesteremia   . Hypertension     Past Surgical History:  Procedure Laterality Date  . ABDOMINAL HYSTERECTOMY    . TONSILLECTOMY      There were no vitals filed for this visit.      Subjective Assessment - 01/13/16 0853    Subjective I am more achey today. I had the worst headache today. I am having trouble with anxiety and driving.    Currently in Pain? Yes   Pain Score 5    Pain Location Back  left neck, left shoulder, down to left calf.                          [facility] Adult PT Treatment/Exercise - 01/13/16 0001      Lumbar Exercises: Stretches   Piriformis Stretch 3 reps;30 seconds   Piriformis Stretch Limitations also figure _0 sec     Lumbar Exercises: Aerobic   Stationary Bike Nustep L5 UE/LE x 8 minutes     Lumbar Exercises: Standing   Other Standing Lumbar Exercises QL side bend stretch with arm over head- cues to shift hips x 5 each     Lumbar Exercises: Sidelying   Other Sidelying Lumbar Exercises Left QL stretch with towel roll x 5 minutes during soft tissue      Moist Heat Therapy   Number Minutes Moist Heat 15 Minutes   Moist Heat Location Lumbar Spine;Cervical  extra layer cervical     Manual Therapy   Manual Therapy Passive ROM   Soft tissue mobilization Left upper trap levator in supine, left QL  with Trigger point release in sidelying, also glute medius    Passive ROM bilateral upper trap stretch left levator stretch supine                PT Education - 01/13/16 0918    Education provided Yes   Education Details HEP   Person(s) Educated Patient   Methods Explanation;Handout   Comprehension Verbalized understanding          PT Short Term Goals - 01/11/16 1007      PT SHORT TERM GOAL #1   Title she will be independent with inital HEP   Baseline cues, Not yet doing consistantly   Time 3   Period Weeks   Status On-going     PT SHORT TERM GOAL #2   Title She will report pain in Lt neck decr 40%   Baseline 50% not yet consistant   Time 3   Period Weeks   Status Partially Met     PT SHORT TERM GOAL #3   Title She will report pain in back decr 40%   Time 3   Period Weeks   Status On-going  PT SHORT TERM GOAL #4   Title She will report no LT arm symptoms   Baseline continues   Time 3   Period Weeks   Status On-going     PT SHORT TERM GOAL #5   Title She will be able to   sit and stand for 20 min or more without increased pain   Time 3   Period Weeks   Status Unable to assess           PT Long Term Goals - 12/29/15 1132      PT LONG TERM GOAL #1   Title She will be independent with all HEP issued   Time 6     PT LONG TERM GOAL #2   Title She will report 75% or more decreased neck pain and will be able to look over shoulder with  no pain.   Time 6   Period Weeks   Status New     PT LONG TERM GOAL #3   Title She will report no back pain with home activties and will able to clean bathroom without pain more than 1-2/10   Time 6   Period Weeks   Status New     PT LONG TERM GOAL #4   Title She will be able to mop with 1-2/10 neck and back pain   Time 6   Period Weeks   Status New     PT LONG TERM GOAL #5   Title she ,with support, will be able to sit as needed for activity with 1-2/10 max pain   Time 6   Period Weeks   Status New      Additional Long Term Goals   Additional Long Term Goals Yes     PT LONG TERM GOAL #6   Title She will report able to do normal shopping with 1-2 max pain   Time 6   Period Weeks   Status New               Plan - 01/13/16 0948    Clinical Impression Statement Pt reports more achiness today. She is tender in left QL, glute medius, and upper trap. Spent most of the session perfoming soft tissue work and stretching. Added hip stretches to HEP.    PT Next Visit Plan modalities STW left lumbar and Left neck , Stretching , review new hip stretches.    PT Home Exercise Plan scapula retraction, knee to chest, neck rotation nad sidebend stretch, posterior pelvic tilt 911/04/2015)  supine ER, yellow band   Consulted and Agree with Plan of Care Patient      Patient will benefit from skilled therapeutic intervention in order to improve the following deficits and impairments:  Pain, Increased muscle spasms, Decreased activity tolerance, Decreased range of motion  Visit Diagnosis: Cervicalgia  Acute bilateral low back pain without sciatica  Acute pain of left shoulder  Pain in left leg  Difficulty in walking, not elsewhere classified     Problem List There are no active problems to display for this patient.   Dorene Ar, [place] 01/13/2016, 9:53 AM  [place], [place], [address] Phone: [phone]   Fax:  [phone]  Name: Deborah Stein MRN: [id number] Date of Birth: 09-Feb-1941

## 2016-01-17 ENCOUNTER — Ambulatory Visit: Payer: Medicare Other | Admitting: Physical Therapy

## 2016-01-17 DIAGNOSIS — M545 Low back pain, unspecified: Secondary | ICD-10-CM

## 2016-01-17 DIAGNOSIS — M542 Cervicalgia: Secondary | ICD-10-CM

## 2016-01-17 DIAGNOSIS — M79605 Pain in left leg: Secondary | ICD-10-CM

## 2016-01-17 DIAGNOSIS — M25512 Pain in left shoulder: Secondary | ICD-10-CM

## 2016-01-17 DIAGNOSIS — R262 Difficulty in walking, not elsewhere classified: Secondary | ICD-10-CM

## 2016-01-17 NOTE — Therapy (Signed)
[place], [place], [address] Phone: [phone]   Fax:  [phone]  Physical Therapy Treatment  Patient Details  Name: Deborah Stein MRN: [id number] Date of Birth: 18-Oct-1940 Referring Provider:  Lavone Orn , MD  Encounter Date: 01/17/2016      PT End of Session - 01/17/16 1324    Visit Number 6   Number of Visits 12   Date for PT Re-Evaluation 02/11/16   PT Start Time 0848   PT Stop Time 0945   PT Time Calculation (min) 57 min   Activity Tolerance Patient tolerated treatment well   Behavior During Therapy [facility] for tasks assessed/performed      Past Medical History:  Diagnosis Date  . Environmental allergies   . Hypercholesteremia   . Hypertension     Past Surgical History:  Procedure Laterality Date  . ABDOMINAL HYSTERECTOMY    . TONSILLECTOMY      There were no vitals filed for this visit.      Subjective Assessment - 01/17/16 0851    Subjective I had a good day Saturday.  No HA today.  Had an all day headach yesterday.  I used good posture to do the laundry and cleaning. I slept longer last night than usual.(Not Sat night)   Pain Score 5    Pain Location Back   Pain Orientation Right;Posterior   Pain Descriptors / Indicators Spasm;Shooting   Pain Radiating Towards left calf   Pain Frequency Constant  was intermitytant Saturday   Aggravating Factors  not sure, turning over in the bed,  Standing a longer time   Pain Relieving Factors change of position   Pain Score 5   Pain Location Neck   Pain Orientation Left;Posterior   Pain Descriptors / Indicators Aching  Sting   Pain Radiating Towards Left thumb   Pain Frequency Intermittent   Aggravating Factors  turning head to left   Pain Relieving Factors good posture                         [facility] Adult PT Treatment/Exercise - 01/17/16 0001      Lumbar Exercises: Stretches   Lower Trunk Rotation Limitations  --  10 X, cues for breathing, this feels good   Piriformis Stretch 3 reps;30 seconds   Piriformis Stretch Limitations also figure 4 3  30  sec     Lumbar Exercises: Supine   Other Supine Lumbar Exercises Decompression shoulder, head press, leg lengthener, leg press 5 X 5 seconds  noticed stinging distal quads (WORK related.     Lumbar Exercises: Sidelying   Other Sidelying Lumbar Exercises left QL stretch over folder pillow during soft tissue work     Cryotherapy   Number Minutes Cryotherapy 10 Minutes   Cryotherapy Location Cervical;Lumbar Spine   Type of Cryotherapy Ice pack     Manual Therapy   Soft tissue mobilization Lt upper trap,  LT QL and others with instrument assist.                  PT Short Term Goals - 01/17/16 1326      PT SHORT TERM GOAL #1   Title she will be independent with inital HEP   Time 3   Period Weeks   Status Unable to assess     PT SHORT TERM GOAL #2   Title She will report pain in Lt neck decr 40%   Baseline 50%  not yet consistant   Time 3   Period Weeks   Status Partially Met     PT SHORT TERM GOAL #3   Title She will report pain in back decr 40%   Time 3   Period Weeks   Status Unable to assess     PT SHORT TERM GOAL #4   Title She will report no LT arm symptoms   Baseline less arm symptoms   Time 3   Period Weeks   Status On-going     PT SHORT TERM GOAL #5   Title She will be able to   sit and stand for 20 min or more without increased pain   Time 3   Period Weeks   Status Unable to assess           PT Long Term Goals - 12/29/15 1132      PT LONG TERM GOAL #1   Title She will be independent with all HEP issued   Time 6     PT LONG TERM GOAL #2   Title She will report 75% or more decreased neck pain and will be able to look over shoulder with  no pain.   Time 6   Period Weeks   Status New     PT LONG TERM GOAL #3   Title She will report no back pain with home activties and will able to clean bathroom  without pain more than 1-2/10   Time 6   Period Weeks   Status New     PT LONG TERM GOAL #4   Title She will be able to mop with 1-2/10 neck and back pain   Time 6   Period Weeks   Status New     PT LONG TERM GOAL #5   Title she ,with support, will be able to sit as needed for activity with 1-2/10 max pain   Time 6   Period Weeks   Status New     Additional Long Term Goals   Additional Long Term Goals Yes     PT LONG TERM GOAL #6   Title She will report able to do normal shopping with 1-2 max pain   Time 6   Period Weeks   Status New               Plan - 01/17/16 1325    Clinical Impression Statement Pain becoming less intense at times.  No HA taday.   ROM quickly gained with stretches to WNL in piriformis.   PT Next Visit Plan modalities STW left lumbar and Left neck , Stretching , review new hip stretches. Check specific goals.   PT Home Exercise Plan scapula retraction, knee to chest, neck rotation nad sidebend stretch, posterior pelvic tilt 911/04/2015)  supine ER, yellow band   Consulted and Agree with Plan of Care Patient      Patient will benefit from skilled therapeutic intervention in order to improve the following deficits and impairments:  Pain, Increased muscle spasms, Decreased activity tolerance, Decreased range of motion  Visit Diagnosis: Cervicalgia  Acute bilateral low back pain without sciatica  Acute pain of left shoulder  Pain in left leg  Difficulty in walking, not elsewhere classified     Problem List There are no active problems to display for this patient.   HARRIS,KAREN PTA 01/17/2016, 1:28 PM  [facility] [address] [place], [place], [address] Phone: [phone]   Fax:  [phone]  Name: Deborah Stein MRN: [id number] Date of Birth: September 18, 1940

## 2016-01-20 ENCOUNTER — Ambulatory Visit: Payer: Medicare Other | Admitting: Physical Therapy

## 2016-01-20 DIAGNOSIS — M542 Cervicalgia: Secondary | ICD-10-CM | POA: Diagnosis not present

## 2016-01-20 DIAGNOSIS — M545 Low back pain, unspecified: Secondary | ICD-10-CM

## 2016-01-20 DIAGNOSIS — R262 Difficulty in walking, not elsewhere classified: Secondary | ICD-10-CM

## 2016-01-20 DIAGNOSIS — M25512 Pain in left shoulder: Secondary | ICD-10-CM

## 2016-01-20 DIAGNOSIS — M79605 Pain in left leg: Secondary | ICD-10-CM

## 2016-01-20 NOTE — Therapy (Signed)
[place], [place], [address] Phone: [phone]   Fax:  [phone]  Physical Therapy Treatment  Patient Details  Name: Deborah Stein MRN: [id number] Date of Birth: 07-22-1940 Referring Provider:  Lavone Orn , MD  Encounter Date: 01/20/2016      PT End of Session - 01/20/16 0911    Visit Number 7   Number of Visits 12   Date for PT Re-Evaluation 02/11/16   Authorization Type UHR MCR   PT Start Time 0845   PT Stop Time 0945   PT Time Calculation (min) 60 min      Past Medical History:  Diagnosis Date  . Environmental allergies   . Hypercholesteremia   . Hypertension     Past Surgical History:  Procedure Laterality Date  . ABDOMINAL HYSTERECTOMY    . TONSILLECTOMY      There were no vitals filed for this visit.      Subjective Assessment - 01/20/16 0848    Subjective I am aching today. Knee is grabing and there is a feeling on the outside of my lower left leg 8/10 with transitions. My left neck/shoulder are aching.    Pain Location Back   Pain Orientation Left   Pain Descriptors / Indicators Aching   Pain Score 7   Pain Location Neck   Pain Descriptors / Indicators Aching   Pain Radiating Towards left shoulder   Pain Onset 1 to 4 weeks ago                         [facility] Adult PT Treatment/Exercise - 01/20/16 0001      Neck Exercises: Theraband   Rows 15 reps;Red   Rows Limitations seated and standing     Lumbar Exercises: Stretches   Active Hamstring Stretch 3 reps;30 seconds   Passive Hamstring Stretch --   Passive Hamstring Stretch Limitations --   Single Knee to Chest Stretch 30 seconds;3 reps   Lower Trunk Rotation Limitations 10 X  with ball   Piriformis Stretch 3 reps;30 seconds   Piriformis Stretch Limitations also figure _0 sec     Moist Heat Therapy   Number Minutes Moist Heat 15 Minutes   Moist Heat Location Cervical     Ultrasound   Ultrasound Location Lt neck, upper trap   Ultrasound Parameters 100% 1MHZ 1.5 w/cm2 x 5 minutes   Ultrasound Goals Pain     Manual Therapy   Joint Mobilization long axis distraction left hip and A/P grade 3 mobs     Neck Exercises: Stretches   Upper Trapezius Stretch 10 seconds;3 reps   Levator Stretch 3 reps;10 seconds                PT Education - 01/20/16 1022    Education provided Yes   Education Details HEP   Person(s) Educated Patient   Methods Explanation   Comprehension Verbalized understanding          PT Short Term Goals - 01/17/16 1326      PT SHORT TERM GOAL #1   Title she will be independent with inital HEP   Time 3   Period Weeks   Status Unable to assess     PT SHORT TERM GOAL #2   Title She will report pain in Lt neck decr 40%   Baseline 50% not yet consistant   Time 3   Period Weeks   Status  Partially Met     PT SHORT TERM GOAL #3   Title She will report pain in back decr 40%   Time 3   Period Weeks   Status Unable to assess     PT SHORT TERM GOAL #4   Title She will report no LT arm symptoms   Baseline less arm symptoms   Time 3   Period Weeks   Status On-going     PT SHORT TERM GOAL #5   Title She will be able to   sit and stand for 20 min or more without increased pain   Time 3   Period Weeks   Status Unable to assess           PT Long Term Goals - 12/29/15 1132      PT LONG TERM GOAL #1   Title She will be independent with all HEP issued   Time 6     PT LONG TERM GOAL #2   Title She will report 75% or more decreased neck pain and will be able to look over shoulder with  no pain.   Time 6   Period Weeks   Status New     PT LONG TERM GOAL #3   Title She will report no back pain with home activties and will able to clean bathroom without pain more than 1-2/10   Time 6   Period Weeks   Status New     PT LONG TERM GOAL #4   Title She will be able to mop with 1-2/10 neck and back pain   Time 6   Period Weeks    Status New     PT LONG TERM GOAL #5   Title she ,with support, will be able to sit as needed for activity with 1-2/10 max pain   Time 6   Period Weeks   Status New     Additional Long Term Goals   Additional Long Term Goals Yes     PT LONG TERM GOAL #6   Title She will report able to do normal shopping with 1-2 max pain   Time 6   Period Weeks   Status New               Plan - 01/20/16 0910    Clinical Impression Statement Pt reports more achiness today. She was feeling good yesterday and may have over done things by doing to many errands and grocerry shopping/ household chores. her biggest complaint is left knee grabbing and lower leg sensations. We worked on left hip mobs and stretching with pt reporting decreased stiffness post. Reviewed neck stretches and added levator stretch. Ultrasound to left upper trap to reduce pain and tension. Instructed pt in standing row with red band for HEP. HMP requested at end of treatment. Pt reports this session provided relief of pain.    PT Next Visit Plan check STGs ; review row with red band-(print picture); assess response to left hip mobs, add more stab? FOTO   PT Home Exercise Plan scapula retraction, knee to chest, neck rotation nad sidebend stretch, posterior pelvic tilt 911/04/2015)  supine ER, yellow band, red band row   Consulted and Agree with Plan of Care Patient      Patient will benefit from skilled therapeutic intervention in order to improve the following deficits and impairments:  Pain, Increased muscle spasms, Decreased activity tolerance, Decreased range of motion  Visit Diagnosis: Cervicalgia  Acute bilateral low back pain without  sciatica  Acute pain of left shoulder  Pain in left leg  Difficulty in walking, not elsewhere classified     Problem List There are no active problems to display for this patient.   Dorene Ar, [place] 01/20/2016, 10:29 AM  [place], [place], [address] Phone: [phone]   Fax:  [phone]  Name: Deborah Stein MRN: [id number] Date of Birth: 1940/09/20

## 2016-01-20 NOTE — Patient Instructions (Signed)
  Resistive Band Rowing   With resistive band anchored in door, grasp both ends. Keeping elbows bent, pull back, squeezing shoulder blades together. Hold _5___ seconds. Repeat __10-20__ times. Do __2__ sessions per day.  http://gt2.exer.us/97   Copyright  VHI. All rights reserved.

## 2016-01-24 ENCOUNTER — Ambulatory Visit: Payer: Medicare Other

## 2016-01-24 DIAGNOSIS — M79605 Pain in left leg: Secondary | ICD-10-CM

## 2016-01-24 DIAGNOSIS — M542 Cervicalgia: Secondary | ICD-10-CM | POA: Diagnosis not present

## 2016-01-24 DIAGNOSIS — R262 Difficulty in walking, not elsewhere classified: Secondary | ICD-10-CM

## 2016-01-24 DIAGNOSIS — M545 Low back pain, unspecified: Secondary | ICD-10-CM

## 2016-01-24 DIAGNOSIS — M25512 Pain in left shoulder: Secondary | ICD-10-CM

## 2016-01-24 NOTE — Therapy (Signed)
[place], [place], [address] Phone: [phone]   Fax:  [phone]  Physical Therapy Treatment  Patient Details  Name: Deborah Stein MRN: [id number] Date of Birth: 06-09-1940 Referring Provider:  Lavone Orn , MD  Encounter Date: 01/24/2016      PT End of Session - 01/24/16 0925    Visit Number 8   Number of Visits 12   Date for PT Re-Evaluation 02/11/16   Authorization Type UHR MCR   PT Start Time 0845   PT Stop Time 0945   PT Time Calculation (min) 60 min   Activity Tolerance Patient tolerated treatment well   Behavior During Therapy [facility] for tasks assessed/performed      Past Medical History:  Diagnosis Date  . Environmental allergies   . Hypercholesteremia   . Hypertension     Past Surgical History:  Procedure Laterality Date  . ABDOMINAL HYSTERECTOMY    . TONSILLECTOMY      There were no vitals filed for this visit.      Subjective Assessment - 01/24/16 0846    Subjective She reports improvement with pain stoopped going to toe and neck pain decr.    Currently in Pain? Yes   Pain Score 5    Pain Location Back   Pain Orientation Left   Pain Descriptors / Indicators Aching   Pain Type Acute pain   Pain Onset More than a month ago   Pain Frequency Constant   Aggravating Factors  sitting 30 min ,lifting groceries   Pain Relieving Factors change position   Pain Score 4   Pain Location Neck   Pain Orientation Left;Posterior   Pain Descriptors / Indicators Aching   Pain Type Acute pain   Pain Onset More than a month ago   Pain Frequency Intermittent   Aggravating Factors  turning head LT    Pain Relieving Factors good posture , lying down                         [facility] Adult PT Treatment/Exercise - 01/24/16 0855      Lumbar Exercises: Stretches   Active Hamstring Stretch 3 reps;30 seconds   Double Knee to Chest Stretch 2 reps;30 seconds   Pelvic Tilt 10  seconds  10 reps   Piriformis Stretch 3 reps;30 seconds   Piriformis Stretch Limitations also figure 4      2x30 sec with press knee distally and pull LT leg toward chest  2x 20 sec     Lumbar Exercises: Supine   Bridge 10 reps   Bridge Limitations cues to maintain abdominal activity as able      Moist Heat Therapy   Number Minutes Moist Heat 15 Minutes   Moist Heat Location Cervical;Lumbar Spine     Manual Therapy   Joint Mobilization long axis distraction left hip and A/P grade 3 mobs   Soft tissue mobilization cervical spine and back LT side  Most on LT sideparaspinals and QL    Passive ROM cervical Rotation and Side bend RT and LT 2 x 20 sec.   2x25 sec QL stretch                 PT Education - 01/24/16 0925    Education provided Yes   Education Details discussed that some soreness was expected with stretching and STW exercise day of or next day post PT   Person(s) Educated Patient  Methods Explanation   Comprehension Verbalized understanding          PT Short Term Goals - 20-Feb-2016 0849      PT SHORT TERM GOAL #1   Title she will be independent with inital HEP   Baseline doing HEP 1x/day   Status Not Met     PT SHORT TERM GOAL #2   Title She will report pain in Lt neck decr 40%   Baseline 50%   Status Achieved     PT SHORT TERM GOAL #3   Title She will report pain in back decr 40%   Baseline 50%   Status Achieved     PT SHORT TERM GOAL #4   Title She will report no LT arm symptoms   Baseline 50% better   Status On-going     PT SHORT TERM GOAL #5   Title She will be able to   sit and stand for 20 min or more without increased pain   Status Achieved           PT Long Term Goals - 02-20-16 0851      PT LONG TERM GOAL #1   Title She will be independent with all HEP issued   Status On-going     PT LONG TERM GOAL #2   Title She will report 75% or more decreased neck pain and will be able to look over shoulder with  no pain.   Status  On-going     PT LONG TERM GOAL #3   Title She will report no back pain with home activties and will able to clean bathroom without pain more than 1-2/10   Status On-going     PT LONG TERM GOAL #4   Title She will be able to mop with 1-2/10 neck and back pain   Status On-going     PT LONG TERM GOAL #5   Title she ,with support, will be able to sit as needed for activity with 1-2/10 max pain   Status On-going     PT LONG TERM GOAL #6   Title She will report able to do normal shopping with 1-2 max pain   Status On-going               Plan - 02-20-2016 3662    Clinical Impression Statement Pt reports improving and appears in no distress. Movement patterns are all sooth with walking and transition movements. Cervical ROM WNL. continue tender LT neck and back She reported some leg symptoms post treatment to back that should ease off quickly.    PT Treatment/Interventions Electrical Stimulation;Moist Heat;Traction;Ultrasound;Passive range of motion;Patient/family education;Taping;Manual techniques;Therapeutic exercise;Dry needling   PT Next Visit Plan ; review row with red band-(print picture); assess response to left hip mobs, add more stab? Measure RO<M then note to MD by visit 10   PT Home Exercise Plan scapula retraction, knee to chest, neck rotation nad sidebend stretch, posterior pelvic tilt 911/04/2015)  supine ER, yellow band, red band row   Consulted and Agree with Plan of Care Patient      Patient will benefit from skilled therapeutic intervention in order to improve the following deficits and impairments:  Pain, Increased muscle spasms, Decreased activity tolerance, Decreased range of motion  Visit Diagnosis: Cervicalgia  Acute bilateral low back pain without sciatica  Acute pain of left shoulder  Pain in left leg  Difficulty in walking, not elsewhere classified       G-Codes - Feb 20, 2016 9476  Functional Assessment Tool Used FOTO  40% limited   Functional  Limitation Other PT primary   Other PT Primary Current Status (P3295) At least 40 percent but less than 60 percent impaired, limited or restricted   Other PT Primary Goal Status (J8841) At least 1 percent but less than 20 percent impaired, limited or restricted      Problem List There are no active problems to display for this patient.   Darrel Hoover  PT 01/24/2016, 9:29 AM  [facility] [address] [place], [place], [address] Phone: [phone]   Fax:  [phone]  Name: ERICK OXENDINE MRN: [id number] Date of Birth: 05/16/1940

## 2016-01-26 ENCOUNTER — Ambulatory Visit: Payer: Medicare Other | Admitting: Physical Therapy

## 2016-01-26 DIAGNOSIS — M542 Cervicalgia: Secondary | ICD-10-CM | POA: Diagnosis not present

## 2016-01-26 DIAGNOSIS — M25512 Pain in left shoulder: Secondary | ICD-10-CM

## 2016-01-26 DIAGNOSIS — M545 Low back pain, unspecified: Secondary | ICD-10-CM

## 2016-01-26 DIAGNOSIS — R262 Difficulty in walking, not elsewhere classified: Secondary | ICD-10-CM

## 2016-01-26 DIAGNOSIS — M79605 Pain in left leg: Secondary | ICD-10-CM

## 2016-01-26 NOTE — Patient Instructions (Signed)
Row, extension issued from EX drawer 2-3 x a week 10-20 X

## 2016-01-26 NOTE — Therapy (Signed)
[place], [place], [address] Phone: [phone]   Fax:  [phone]  Physical Therapy Treatment  Patient Details  Name: Deborah Stein MRN: [id number] Date of Birth: 09/26/1940 Referring Provider:  Lavone Orn , MD  Encounter Date: 01/26/2016      PT End of Session - 01/26/16 0937    Visit Number 9   Number of Visits 12   Date for PT Re-Evaluation 02/11/16   PT Start Time 0930   PT Stop Time 1030   PT Time Calculation (min) 60 min   Activity Tolerance Patient tolerated treatment well   Behavior During Therapy [facility] for tasks assessed/performed      Past Medical History:  Diagnosis Date  . Environmental allergies   . Hypercholesteremia   . Hypertension     Past Surgical History:  Procedure Laterality Date  . ABDOMINAL HYSTERECTOMY    . TONSILLECTOMY      There were no vitals filed for this visit.      Subjective Assessment - 01/26/16 0904    Subjective LT upper trap sore,  Leg pain then tingling to LT lower leg.   Currently in Pain? Yes   Pain Score 4    Pain Location Back   Pain Orientation Left   Pain Radiating Towards left calf   Pain Frequency Constant   Pain Score 6   Pain Orientation Left;Posterior   Pain Frequency Intermittent                         [facility] Adult PT Treatment/Exercise - 01/26/16 0001      Neck Exercises: Machines for Strengthening   UBE (Upper Arm Bike) Nu step 6 minutes     Neck Exercises: Standing   Other Standing Exercises row and shoulder extension, red band 10 X each with cues,  written issued     Neck Exercises: Supine   Other Supine Exercise Supine scapular stabilization:  horizontal pulls, ER, Sash 10 x each, red band  Heavy cues     Lumbar Exercises: Stretches   Passive Hamstring Stretch 3 reps;30 seconds  with foot pumps   Piriformis Stretch 3 reps;30 seconds  released pain on LEFT with stretch right hip.     Moist Heat  Therapy   Number Minutes Moist Heat 15 Minutes   Moist Heat Location Cervical;Lumbar Spine  extra layers                PT Education - 01/26/16 0936    Education provided Yes   Education Details Exercise   Person(s) Educated Patient   Methods Explanation;Demonstration;Tactile cues;Verbal cues;Handout   Comprehension Verbalized understanding;Returned demonstration;Need further instruction          PT Short Term Goals - 01/24/16 0849      PT SHORT TERM GOAL #1   Title she will be independent with inital HEP   Baseline doing HEP 1x/day   Status Not Met     PT SHORT TERM GOAL #2   Title She will report pain in Lt neck decr 40%   Baseline 50%   Status Achieved     PT SHORT TERM GOAL #3   Title She will report pain in back decr 40%   Baseline 50%   Status Achieved     PT SHORT TERM GOAL #4   Title She will report no LT arm symptoms   Baseline 50% better   Status On-going  PT SHORT TERM GOAL #5   Title She will be able to   sit and stand for 20 min or more without increased pain   Status Achieved           PT Long Term Goals - 01/24/16 0851      PT LONG TERM GOAL #1   Title She will be independent with all HEP issued   Status On-going     PT LONG TERM GOAL #2   Title She will report 75% or more decreased neck pain and will be able to look over shoulder with  no pain.   Status On-going     PT LONG TERM GOAL #3   Title She will report no back pain with home activties and will able to clean bathroom without pain more than 1-2/10   Status On-going     PT LONG TERM GOAL #4   Title She will be able to mop with 1-2/10 neck and back pain   Status On-going     PT LONG TERM GOAL #5   Title she ,with support, will be able to sit as needed for activity with 1-2/10 max pain   Status On-going     PT LONG TERM GOAL #6   Title She will report able to do normal shopping with 1-2 max pain   Status On-going               Plan - 01/26/16 1225     Clinical Impression Statement Progress toward HEP goals.   PT Next Visit Plan ; review row with red band-(print picture); assess response to left hip mobs, add more stab? Measure RO<M then note to MD by visit 10   PT Home Exercise Plan scapula retraction, knee to chest, neck rotation nad sidebend stretch, posterior pelvic tilt 911/04/2015)  supine ER, yellow band, red band row   Consulted and Agree with Plan of Care Patient      Patient will benefit from skilled therapeutic intervention in order to improve the following deficits and impairments:  Pain, Increased muscle spasms, Decreased activity tolerance, Decreased range of motion  Visit Diagnosis: Cervicalgia  Acute bilateral low back pain without sciatica  Acute pain of left shoulder  Pain in left leg  Difficulty in walking, not elsewhere classified     Problem List There are no active problems to display for this patient.   Daric Koren  PTA 01/26/2016, 9:52 AM  [facility] [address] [place], [place], [address] Phone: [phone]   Fax:  [phone]  Name: CHENOA LUDDY MRN: [id number] Date of Birth: Jun 06, 1940

## 2016-01-31 ENCOUNTER — Ambulatory Visit: Payer: Medicare Other | Admitting: Physical Therapy

## 2016-01-31 DIAGNOSIS — M542 Cervicalgia: Secondary | ICD-10-CM | POA: Diagnosis not present

## 2016-01-31 DIAGNOSIS — M545 Low back pain, unspecified: Secondary | ICD-10-CM

## 2016-01-31 DIAGNOSIS — M25512 Pain in left shoulder: Secondary | ICD-10-CM

## 2016-01-31 DIAGNOSIS — M79605 Pain in left leg: Secondary | ICD-10-CM

## 2016-01-31 DIAGNOSIS — R262 Difficulty in walking, not elsewhere classified: Secondary | ICD-10-CM

## 2016-01-31 NOTE — Therapy (Signed)
[place], [place], [address] Phone: [phone]   Fax:  [phone]  Physical Therapy Treatment  Patient Details  Name: CHERICA HEIDEN MRN: [id number] Date of Birth: 03/27/1940 Referring Provider:  Lavone Orn , MD  Encounter Date: 01/31/2016      PT End of Session - 01/31/16 0846    Visit Number 10   Number of Visits 12   Date for PT Re-Evaluation 02/11/16   Authorization Type UHR MCR   PT Start Time 0848   PT Stop Time 0945   PT Time Calculation (min) 57 min      Past Medical History:  Diagnosis Date  . Environmental allergies   . Hypercholesteremia   . Hypertension     Past Surgical History:  Procedure Laterality Date  . ABDOMINAL HYSTERECTOMY    . TONSILLECTOMY      There were no vitals filed for this visit.      Subjective Assessment - 01/31/16 0851    Subjective I started doing my exercises 2 x a day instead of once and I am more sore with a little more pain.    Currently in Pain? Yes   Pain Score 6    Pain Location Back   Pain Orientation Left   Pain Descriptors / Indicators Sore  stinging   Aggravating Factors  sitting more than 30-35 minutes   Pain Relieving Factors change position   Pain Score 5   Pain Location Neck   Pain Orientation Left   Pain Descriptors / Indicators Aching  stiffness   Aggravating Factors  turning head to left    Pain Relieving Factors good posture, not turning left, lying down            [facility] PT Assessment - 01/31/16 0001      AROM   Cervical Flexion 65   Cervical Extension 52   Cervical - Right Side Bend 43   Cervical - Left Side Bend 43   Cervical - Right Rotation 70   Cervical - Left Rotation 55                     [facility] Adult PT Treatment/Exercise - 01/31/16 0001      Neck Exercises: Theraband   Shoulder Extension 15 reps;Red   Rows 15 reps;Red     Lumbar Exercises: Stretches   Piriformis Stretch 3 reps;30  seconds   Piriformis Stretch Limitations also figure 4 with rotation to stretch posterior hip capsule     Moist Heat Therapy   Number Minutes Moist Heat 15 Minutes   Moist Heat Location Cervical;Lumbar Spine  extra layers     Manual Therapy   Joint Mobilization long axis distraction left hip and A/P grade 3 mobs   Soft tissue mobilization Lt rhomboid, upper, mid traps, levator, Trigger point release during active levator stretch     Neck Exercises: Stretches   Levator Stretch 3 reps;10 seconds                  PT Short Term Goals - 01/31/16 0855      PT SHORT TERM GOAL #1   Title she will be independent with inital HEP   Baseline doing HEP 2 x per day   Time 3   Period Weeks   Status Achieved     PT SHORT TERM GOAL #2   Title She will report pain in Lt neck decr 40%   Baseline 50%  Time 3   Period Weeks   Status Achieved     PT SHORT TERM GOAL #3   Title She will report pain in back decr 40%   Baseline 50%   Time 3   Period Weeks   Status Achieved     PT SHORT TERM GOAL #4   Title She will report no LT arm symptoms   Baseline 50% better, had electric feeling go down left side of body while turning in bed    Time 3   Period Weeks   Status On-going     PT SHORT TERM GOAL #5   Title She will be able to   sit and stand for 20 min or more without increased pain   Time 3   Period Weeks   Status Achieved           PT Long Term Goals - 01/24/16 4765      PT LONG TERM GOAL #1   Title She will be independent with all HEP issued   Status On-going     PT LONG TERM GOAL #2   Title She will report 75% or more decreased neck pain and will be able to look over shoulder with  no pain.   Status On-going     PT LONG TERM GOAL #3   Title She will report no back pain with home activties and will able to clean bathroom without pain more than 1-2/10   Status On-going     PT LONG TERM GOAL #4   Title She will be able to mop with 1-2/10 neck and back pain    Status On-going     PT LONG TERM GOAL #5   Title she ,with support, will be able to sit as needed for activity with 1-2/10 max pain   Status On-going     PT LONG TERM GOAL #6   Title She will report able to do normal shopping with 1-2 max pain   Status On-going               Plan - 01/31/16 0857    Clinical Impression Statement She is able to do short shopping trips 15-20 minutes. She asks for bags to be packed lightly and she does well if she distributes weight evenly between UEs. Sitting tolerance has improved to 35 minutes. She increased HEP frequency STG#1 Met. She has noticed increased soreness and pain since increaseing HEP frequency. She has not returned to mopping or moderate house hold chores. Manual techniques used to decrease pain. She demonstrates decreased left cervical rotation today, imoroved with less pain after manual today.   PT Next Visit Plan continue manual, ?dry needle?   hip mobs, add more stab?    PT Home Exercise Plan scapula retraction, knee to chest, neck rotation nad sidebend stretch, posterior pelvic tilt 911/04/2015)  supine ER, yellow band, red band row   Consulted and Agree with Plan of Care Patient      Patient will benefit from skilled therapeutic intervention in order to improve the following deficits and impairments:  Pain, Increased muscle spasms, Decreased activity tolerance, Decreased range of motion  Visit Diagnosis: Cervicalgia  Acute bilateral low back pain without sciatica  Acute pain of left shoulder  Pain in left leg  Difficulty in walking, not elsewhere classified     Problem List There are no active problems to display for this patient.   Dorene Ar, PTA 01/31/2016, 2:50 PM  [facility]  [place], [place], [address] Phone: [phone]   Fax:  [phone]  Name: MILTON STREICHER MRN: [id number] Date of Birth: 1941-01-09

## 2016-02-03 ENCOUNTER — Ambulatory Visit: Payer: Medicare Other | Admitting: Physical Therapy

## 2016-02-03 DIAGNOSIS — M25512 Pain in left shoulder: Secondary | ICD-10-CM

## 2016-02-03 DIAGNOSIS — M79605 Pain in left leg: Secondary | ICD-10-CM

## 2016-02-03 DIAGNOSIS — M542 Cervicalgia: Secondary | ICD-10-CM

## 2016-02-03 DIAGNOSIS — R262 Difficulty in walking, not elsewhere classified: Secondary | ICD-10-CM

## 2016-02-03 DIAGNOSIS — M545 Low back pain, unspecified: Secondary | ICD-10-CM

## 2016-02-03 NOTE — Therapy (Signed)
[facility] [address] [place], [place], [address] Phone: [phone]   Fax:  [phone]  Physical Therapy Treatment  Patient Details  Name: Deborah Stein MRN: [id number] Date of Birth: 11/29/1940 Referring Provider:  Kirby FunkJohn Griffin , MD  Encounter Date: 02/03/2016      PT End of Session - 02/03/16 0940    Visit Number 11   Number of Visits 12   Date for PT Re-Evaluation 02/11/16   Authorization Type UHR MCR   PT Start Time 0845   PT Stop Time 0940   PT Time Calculation (min) 55 min      Past Medical History:  Diagnosis Date  . Environmental allergies   . Hypercholesteremia   . Hypertension     Past Surgical History:  Procedure Laterality Date  . ABDOMINAL HYSTERECTOMY    . TONSILLECTOMY      There were no vitals filed for this visit.      Subjective Assessment - 02/03/16 0938    Subjective My neck has been acting up. 6/10 Left neck and shooting down left arm to thumb. I also had a back pain in the bath tub and my left big toe extended for 2 minutes on its own.    Currently in Pain? Yes   Pain Score 6    Pain Location Neck   Pain Orientation Left   Pain Radiating Towards left thumb   Pain Score 4   Pain Location Back                         [facility] Adult PT Treatment/Exercise - 02/03/16 0001      Lumbar Exercises: Aerobic   Stationary Bike Nustep L4 x 8 min UE/LE cue for posture and shouders relaxed     Modalities   Modalities Traction     Moist Heat Therapy   Number Minutes Moist Heat 10 Minutes   Moist Heat Location Cervical;Lumbar Spine     Ultrasound   Ultrasound Location Lt Neck upper 100% 1 mhz , 1.4 w/cm2   Ultrasound Goals Pain     Traction   Type of Traction Cervical   Min (lbs) 15   Max (lbs) 7   Hold Time 60   Rest Time 10   Time 10                  PT Short Term Goals - 01/31/16 34740855      PT SHORT TERM GOAL #1   Title she will be independent  with inital HEP   Baseline doing HEP 2 x per day   Time 3   Period Weeks   Status Achieved     PT SHORT TERM GOAL #2   Title She will report pain in Lt neck decr 40%   Baseline 50%   Time 3   Period Weeks   Status Achieved     PT SHORT TERM GOAL #3   Title She will report pain in back decr 40%   Baseline 50%   Time 3   Period Weeks   Status Achieved     PT SHORT TERM GOAL #4   Title She will report no LT arm symptoms   Baseline 50% better, had electric feeling go down left side of body while turning in bed    Time 3   Period Weeks   Status On-going     PT SHORT TERM GOAL #5   Title She  will be able to   sit and stand for 20 min or more without increased pain   Time 3   Period Weeks   Status Achieved           PT Long Term Goals - 01/24/16 [id number]      PT LONG TERM GOAL #1   Title She will be independent with all HEP issued   Status On-going     PT LONG TERM GOAL #2   Title She will report 75% or more decreased neck pain and will be able to look over shoulder with  no pain.   Status On-going     PT LONG TERM GOAL #3   Title She will report no back pain with home activties and will able to clean bathroom without pain more than 1-2/10   Status On-going     PT LONG TERM GOAL #4   Title She will be able to mop with 1-2/10 neck and back pain   Status On-going     PT LONG TERM GOAL #5   Title she ,with support, will be able to sit as needed for activity with 1-2/10 max pain   Status On-going     PT LONG TERM GOAL #6   Title She will report able to do normal shopping with 1-2 max pain   Status On-going               Plan - 02/03/16 0944    Clinical Impression Statement Trial of Traction today as pt reports helpful in past with chiropractor treatment of neck. After traction, pt reports decreased intensity or arm symptoms. Will assess benefit  more next visit.    PT Next Visit Plan continue manual, ?dry needle?   hip mobs, add more stab?  Tracton helpful?  repeat?   PT Home Exercise Plan scapula retraction, knee to chest, neck rotation nad sidebend stretch, posterior pelvic tilt 911/04/2015)  supine ER, yellow band, red band row   Consulted and Agree with Plan of Care Patient      Patient will benefit from skilled therapeutic intervention in order to improve the following deficits and impairments:  Pain, Increased muscle spasms, Decreased activity tolerance, Decreased range of motion  Visit Diagnosis: Cervicalgia  Acute bilateral low back pain without sciatica  Acute pain of left shoulder  Pain in left leg  Difficulty in walking, not elsewhere classified     Problem List There are no active problems to display for this patient.   [name], [name], [place]PTA 02/03/2016, 9:45 AM  [facility] [address] [place], [place], [address] Phone: [phone]   Fax:  [phone]  Name: Deborah Stein MRN: [id number] Date of Birth: 1940/08/19

## 2016-02-07 ENCOUNTER — Ambulatory Visit: Payer: Medicare Other | Attending: Internal Medicine

## 2016-02-07 ENCOUNTER — Ambulatory Visit: Payer: Medicare Other | Admitting: Physical Therapy

## 2016-02-07 DIAGNOSIS — M542 Cervicalgia: Secondary | ICD-10-CM | POA: Diagnosis present

## 2016-02-07 DIAGNOSIS — R262 Difficulty in walking, not elsewhere classified: Secondary | ICD-10-CM | POA: Diagnosis present

## 2016-02-07 DIAGNOSIS — M79605 Pain in left leg: Secondary | ICD-10-CM | POA: Diagnosis present

## 2016-02-07 DIAGNOSIS — M545 Low back pain, unspecified: Secondary | ICD-10-CM

## 2016-02-07 DIAGNOSIS — M25512 Pain in left shoulder: Secondary | ICD-10-CM | POA: Insufficient documentation

## 2016-02-07 NOTE — Therapy (Signed)
[facility] [address] [place], [place], [address] Phone: [phone]   Fax:  [phone]  Physical Therapy Treatment  Patient Details  Name: Deborah Stein MRN: [id number] Date of Birth: 1940/09/15 Referring Provider:  Kirby FunkJohn Griffin , MD  Encounter Date: 02/07/2016      PT End of Session - 02/07/16 1437    Visit Number 12   Number of Visits 12   Date for PT Re-Evaluation 02/11/16   Authorization Type UHR MCR   PT Start Time 0215   PT Stop Time 0310   PT Time Calculation (min) 55 min   Activity Tolerance Patient tolerated treatment well   Behavior During Therapy [facility] for tasks assessed/performed      Past Medical History:  Diagnosis Date  . Environmental allergies   . Hypercholesteremia   . Hypertension     Past Surgical History:  Procedure Laterality Date  . ABDOMINAL HYSTERECTOMY    . TONSILLECTOMY      There were no vitals filed for this visit.      Subjective Assessment - 02/07/16 1432    Subjective Tired of hurting Took advil today After traction pain eased in neck to 3/10   Currently in Pain? Yes   Pain Score 5    Pain Location Neck   Pain Orientation Left   Pain Descriptors / Indicators Sore;Tender   Pain Type Acute pain   Pain Onset More than a month ago   Pain Frequency Constant   Aggravating Factors  turn head to lt   Pain Relieving Factors meds , chang position   Pain Score 4   Pain Location Back   Pain Orientation Left   Pain Descriptors / Indicators Aching   Pain Type Acute pain   Pain Onset More than a month ago   Pain Frequency Intermittent   Aggravating Factors  sitting too long   Pain Relieving Factors change position       Lumbar motion WNL and cervical ROM WNL RT and LT       [facility] - 02/07/16 0001      Posture/Postural Control   Posture/Postural Control No significant limitations     AROM   Cervical Flexion 65   Cervical Extension 52   Cervical - Right  Side Bend 43   Cervical - Left Side Bend 43   Cervical - Right Rotation 70   Cervical - Left Rotation 65   Lumbar Flexion 100   Lumbar Extension 25   Lumbar - Right Side Bend 30   Lumbar - Left Side Bend 30     Palpation   Palpation comment Tender LT paraspinals neck and back. and traps and posterior shoulder.      Ambulation/Gait   Gait Comments WNL                     [facility] Adult PT Treatment/Exercise - 02/07/16 0001      Lumbar Exercises: Stretches   Double Knee to Chest Stretch 2 reps;30 seconds   Pelvic Tilt 10 seconds  10reps   Standing Side Bend 1 rep;10 seconds  RT and LT   Piriformis Stretch 2 reps;30 seconds   Piriformis Stretch Limitations also figure 4 with rotation to stretch posterior hip capsule     Lumbar Exercises: Aerobic   Stationary Bike Nustep L4 x 8 min UE/LE cue for posture and shouders relaxed     Ultrasound   Ultrasound Location LT neck   Ultrasound  Parameters 100% , 1.5 Wcm2   Ultrasound Goals Pain     Traction   Type of Traction Cervical   Min (lbs) 15   Max (lbs) 7   Hold Time 60   Rest Time 10   Time 15     Manual Therapy   Soft tissue mobilization Lt rhomboid, upper, mid traps, levator, with rock tool     Neck Exercises: Stretches   Upper Trapezius Stretch 1 rep;20 seconds  RT and LT   Levator Stretch 1 rep;30 seconds  Rt and LT                  PT Short Term Goals - 02/07/16 1516      PT SHORT TERM GOAL #1   Title she will be independent with inital HEP   Status Achieved     PT SHORT TERM GOAL #2   Title She will report pain in Lt neck decr 40%   Status Achieved     PT SHORT TERM GOAL #3   Title She will report pain in back decr 40%   Status Achieved     PT SHORT TERM GOAL #4   Title She will report no LT arm symptoms   Baseline intermittant   Status On-going     PT SHORT TERM GOAL #5   Title She will be able to   sit and stand for 20 min or more without increased pain   Status  Achieved           PT Long Term Goals - 02/07/16 1516      PT LONG TERM GOAL #1   Title She will be independent with all HEP issued   Status On-going     PT LONG TERM GOAL #2   Title She will report 75% or more decreased neck pain and will be able to look over shoulder with  no pain.   Status On-going     PT LONG TERM GOAL #3   Title She will report no back pain with home activties and will able to clean bathroom without pain more than 1-2/10   Status On-going     PT LONG TERM GOAL #4   Title She will be able to mop with 1-2/10 neck and back pain   Status On-going     PT LONG TERM GOAL #5   Title she ,with support, will be able to sit as needed for activity with 1-2/10 max pain   Status On-going     PT LONG TERM GOAL #6   Title She will report able to do normal shopping with 1-2 max pain   Status On-going               Plan - 02/07/16 1437    Clinical Impression Statement Continue with pain levels moderate  , ROM normal and movement patterns normal . Arm symptoms absent today. May need to try needling for myofascial pain. Continue traction   PT Frequency 2x / week   PT Duration 4 weeks  7 more visits if improving in next 4 visits   PT Treatment/Interventions Electrical Stimulation;Moist Heat;Traction;Ultrasound;Passive range of motion;Patient/family education;Taping;Manual techniques;Therapeutic exercise;Dry needling   PT Next Visit Plan continue manual, dry needle   hip mobs, add more stab?  Tracton helpful repeat   PT Home Exercise Plan scapula retraction, knee to chest, neck rotation nad sidebend stretch, posterior pelvic tilt 911/04/2015)  supine ER, yellow band, red band row   Consulted and Agree  with Plan of Care Patient      Patient will benefit from skilled therapeutic intervention in order to improve the following deficits and impairments:  Pain, Increased muscle spasms, Decreased activity tolerance, Decreased range of motion  Visit  Diagnosis: Cervicalgia  Acute bilateral low back pain without sciatica  Acute pain of left shoulder  Pain in left leg     Problem List There are no active problems to display for this patient.   Caprice RedChasse, Adream Parzych M  PT 02/07/2016, 3:39 PM  [facility] [address] [place], [place], [address] Phone: [phone]   Fax:  [phone]  Name: Deborah Stein MRN: [id number] Date of Birth: 1940/10/01

## 2016-02-11 ENCOUNTER — Ambulatory Visit: Payer: Medicare Other

## 2016-02-11 DIAGNOSIS — M545 Low back pain, unspecified: Secondary | ICD-10-CM

## 2016-02-11 DIAGNOSIS — M25512 Pain in left shoulder: Secondary | ICD-10-CM

## 2016-02-11 DIAGNOSIS — M79605 Pain in left leg: Secondary | ICD-10-CM

## 2016-02-11 DIAGNOSIS — M542 Cervicalgia: Secondary | ICD-10-CM

## 2016-02-11 DIAGNOSIS — R262 Difficulty in walking, not elsewhere classified: Secondary | ICD-10-CM

## 2016-02-11 NOTE — Therapy (Signed)
[facility] [address] [place], [place], [address] Phone: [phone]   Fax:  [phone]  Physical Therapy Treatment  Patient Details  Name: Deborah HaloDiane M Petras MRN: [id number] Date of Birth: 15-Oct-1940 Referring Provider:  Kirby FunkJohn Griffin , MD  Encounter Date: 02/11/2016      PT End of Session - 02/11/16 0855    Visit Number 13   Number of Visits 20   Date for PT Re-Evaluation 03/03/16   Authorization Type UHR MCR   PT Start Time 0848   PT Stop Time 0940   PT Time Calculation (min) 52 min   Activity Tolerance Patient tolerated treatment well   Behavior [facility] assessed/performed      Past Medical History:  Diagnosis Date  . Environmental allergies   . Hypercholesteremia   . Hypertension     Past Surgical History:  Procedure Laterality Date  . ABDOMINAL HYSTERECTOMY    . TONSILLECTOMY      There were no vitals filed for this visit.      Subjective Assessment - 02/11/16 0854    Subjective A little better today    Currently in Pain? Yes   Pain Score 4    Pain Location Neck   Pain Orientation Left   Pain Score 3   Pain Location Back                         [facility] Adult PT Treatment/Exercise - 02/11/16 0001      Lumbar Exercises: Stretches   Double Knee to Chest Stretch 2 reps;30 seconds   Pelvic Tilt 10 seconds;5 reps     Lumbar Exercises: Supine   Bent Knee Raise 10 reps  x1 each leg with PPT     Modalities   Modalities Vasopneumatic     Moist Heat Therapy   Number Minutes Moist Heat 10 Minutes   Moist Heat Location Cervical;Lumbar Spine     Vasopneumatic   Vasopnuematic Location  --  RT     Manual Therapy   Soft tissue mobilization Lt rhomboid, upper, mid traps, levator, with rock tool, ande LT lower back                  PT Short Term Goals - 02/07/16 1516      PT SHORT TERM GOAL #1   Title she will be independent with inital HEP   Status  Achieved     PT SHORT TERM GOAL #2   Title She will report pain in Lt neck decr 40%   Status Achieved     PT SHORT TERM GOAL #3   Title She will report pain in back decr 40%   Status Achieved     PT SHORT TERM GOAL #4   Title She will report no LT arm symptoms   Baseline intermittant   Status On-going     PT SHORT TERM GOAL #5   Title She will be able to   sit and stand for 20 min or more without increased pain   Status Achieved           PT Long Term Goals - 02/07/16 1516      PT LONG TERM GOAL #1   Title She will be independent with all HEP issued   Status On-going     PT LONG TERM GOAL #2   Title She will report 75% or more decreased neck pain and will be able  to look over shoulder with  no pain.   Status On-going     PT LONG TERM GOAL #3   Title She will report no back pain with home activties and will able to clean bathroom without pain more than 1-2/10   Status On-going     PT LONG TERM GOAL #4   Title She will be able to mop with 1-2/10 neck and back pain   Status On-going     PT LONG TERM GOAL #5   Title she ,with support, will be able to sit as needed for activity with 1-2/10 max pain   Status On-going     PT LONG TERM GOAL #6   Title She will report able to do normal shopping with 1-2 max pain   Status On-going               Plan - 02/11/16 0856    Clinical Impression Statement slight improvement with pain today.  Will continue with exercise, manual het/US   PT Treatment/Interventions Electrical Stimulation;Moist Heat;Traction;Ultrasound;Passive range of motion;Patient/family education;Taping;Manual techniques;Therapeutic exercise;Dry needling   PT Next Visit Plan continue manual, dry needle   hip mobs, add more stab?  Tracton helpful repeat   PT Home Exercise Plan scapula retraction, knee to chest, neck rotation nad sidebend stretch, posterior pelvic tilt 911/04/2015)  supine ER, yellow band, red band row   Consulted and Agree with Plan of  Care Patient      Patient will benefit from skilled therapeutic intervention in order to improve the following deficits and impairments:  Pain, Increased muscle spasms, Decreased activity tolerance, Decreased range of motion  Visit Diagnosis: Cervicalgia  Acute bilateral low back pain without sciatica  Acute pain of left shoulder  Pain in left leg  Difficulty in walking, not elsewhere classified     Problem List There are no active problems to display for this patient.   Caprice RedChasse, Xolani Degracia M  PT 02/11/2016, 10:36 AM  [facility] [address] [place], [place], [address] Phone: [phone]   Fax:  [phone]  Name: Deborah HaloDiane M Shanker MRN: [id number] Date of Birth: 06-07-1940

## 2016-02-14 ENCOUNTER — Ambulatory Visit: Payer: Medicare Other

## 2016-02-14 DIAGNOSIS — M545 Low back pain, unspecified: Secondary | ICD-10-CM

## 2016-02-14 DIAGNOSIS — R262 Difficulty in walking, not elsewhere classified: Secondary | ICD-10-CM

## 2016-02-14 DIAGNOSIS — M25512 Pain in left shoulder: Secondary | ICD-10-CM

## 2016-02-14 DIAGNOSIS — M542 Cervicalgia: Secondary | ICD-10-CM | POA: Diagnosis not present

## 2016-02-14 DIAGNOSIS — M79605 Pain in left leg: Secondary | ICD-10-CM

## 2016-02-14 NOTE — Therapy (Signed)
[place], [place], [address] Phone: [phone]   Fax:  [phone]  Physical Therapy Treatment  Patient Details  Name: Deborah Stein MRN: [id number] Date of Birth: [birth year] Referring Provider:  Lavone Orn , MD  Encounter Date: 02/14/2016      PT End of Session - 02/14/16 1233    Visit Number 14   Number of Visits 20   Date for PT Re-Evaluation 03/03/16   Authorization Type UHR MCR   PT Start Time 1230   PT Stop Time 1330   PT Time Calculation (min) 60 min   Activity Tolerance Patient tolerated treatment well   Behavior During Therapy [facility] for tasks assessed/performed      Past Medical History:  Diagnosis Date  . Environmental allergies   . Hypercholesteremia   . Hypertension     Past Surgical History:  Procedure Laterality Date  . ABDOMINAL HYSTERECTOMY    . TONSILLECTOMY      There were no vitals filed for this visit.      Subjective Assessment - 02/14/16 1235    Subjective Neck and back 4/10 today                         [facility] Adult PT Treatment/Exercise - 02/14/16 0001      Neck Exercises: Theraband   Shoulder External Rotation 15 reps;Red   Horizontal ABduction 10 reps;Red     Lumbar Exercises: Stretches   Double Knee to Chest Stretch 2 reps;30 seconds   Pelvic Tilt 10 seconds;5 reps   Piriformis Stretch 3 reps;30 seconds  LT hip   Piriformis Stretch Limitations also figure 4 with rotation to stretch posterior hip capsule     Lumbar Exercises: Sidelying   Other Sidelying Lumbar Exercises QL stretching 30 sec x 2 RT sidelye     Moist Heat Therapy   Number Minutes Moist Heat 15 Minutes   Moist Heat Location Cervical;Lumbar Spine     Ultrasound   Ultrasound Location Lt neck and lower back   Ultrasound Parameters 100% ! MHz 1.6 Wcm2   Ultrasound Goals Pain     Manual Therapy   Soft tissue mobilization Lt rhomboid, upper, mid traps, levator, with  rock tool, ande LT lower back     Neck Exercises: Stretches   Upper Trapezius Stretch 2 reps;30 seconds   Levator Stretch 2 reps;30 seconds                  PT Short Term Goals - 02/14/16 1236      PT SHORT TERM GOAL #1   Title she will be independent with inital HEP   Status Achieved     PT SHORT TERM GOAL #2   Title She will report pain in Lt neck decr 40%   Status Achieved     PT SHORT TERM GOAL #3   Title She will report pain in back decr 40%   Status Achieved     PT SHORT TERM GOAL #4   Title She will report no LT arm symptoms   Status On-going     PT SHORT TERM GOAL #5   Title She will be able to   sit and stand for 20 min or more without increased pain   Baseline 45 min sitting , stand at least 20 min   Status Achieved           PT Long Term Goals - 02/14/16 1237  PT LONG TERM GOAL #1   Title She will be independent with all HEP issued   Status On-going     PT LONG TERM GOAL #2   Title She will report 75% or more decreased neck pain and will be able to look over shoulder with  no pain.   Baseline pain mostly to LT    Status Partially Met     PT LONG TERM GOAL #3   Title She will report no back pain with home activties and will able to clean bathroom without pain more than 1-2/10   Baseline Shew does sopme light to moderate cleaning but no sweep/mopping     PT LONG TERM GOAL #4   Title She will be able to mop with 1-2/10 neck and back pain   Status On-going     PT LONG TERM GOAL #5   Title she ,with support, will be able to sit as needed for activity with 1-2/10 max pain   Baseline pain levels > 2/10   Status On-going     PT LONG TERM GOAL #6   Title She will report able to do normal shopping with 1-2 max pain   Baseline has not done yet.    Status On-going               Plan - 02/14/16 1331    Clinical Impression Statement No  changes . Answered questions about use of heat or creams for pain and allowed she could do what  she wanted if eased pain.   See goals set . Encourage her to try to shop brief periods and to sweep if not incr pain   PT Treatment/Interventions Electrical Stimulation;Moist Heat;Traction;Ultrasound;Passive range of motion;Patient/family education;Taping;Manual techniques;Therapeutic exercise;Dry needling   PT Next Visit Plan continue manual, dry needle   hip mobs, add more stab?  Tracton helpful repeat   PT Home Exercise Plan scapula retraction, knee to chest, neck rotation nad sidebend stretch, posterior pelvic tilt 911/04/2015)  supine ER, yellow band, red band row   Consulted and Agree with Plan of Care Patient      Patient will benefit from skilled therapeutic intervention in order to improve the following deficits and impairments:  Pain, Increased muscle spasms, Decreased activity tolerance, Decreased range of motion  Visit Diagnosis: Cervicalgia  Acute bilateral low back pain without sciatica  Acute pain of left shoulder  Pain in left leg  Difficulty in walking, not elsewhere classified     Problem List There are no active problems to display for this patient.   Deborah Stein  PT 02/14/2016, 1:34 PM  [facility] [address] [place], [place], [address] Phone: [phone]   Fax:  [phone]  Name: Deborah Stein MRN: [id number] Date of Birth: Feb 24, 1941

## 2016-02-16 ENCOUNTER — Ambulatory Visit: Payer: Medicare Other

## 2016-02-16 DIAGNOSIS — M25512 Pain in left shoulder: Secondary | ICD-10-CM

## 2016-02-16 DIAGNOSIS — M545 Low back pain, unspecified: Secondary | ICD-10-CM

## 2016-02-16 DIAGNOSIS — M542 Cervicalgia: Secondary | ICD-10-CM

## 2016-02-16 DIAGNOSIS — M79605 Pain in left leg: Secondary | ICD-10-CM

## 2016-02-16 NOTE — Therapy (Signed)
[place], [place], [address] Phone: [phone]   Fax:  [phone]  Physical Therapy Treatment  Patient Details  Name: TIKI TUCCIARONE MRN: [id number] Date of Birth: 08-24-1940 Referring Provider:  Lavone Orn , MD  Encounter Date: 02/16/2016      PT End of Session - 02/16/16 0806    Visit Number 15   Number of Visits 20   Date for PT Re-Evaluation 03/03/16   Authorization Type UHR MCR   PT Start Time 0800   PT Stop Time 0850   PT Time Calculation (min) 50 min   Activity Tolerance Patient tolerated treatment well;No increased pain   Behavior During Therapy WFL for tasks assessed/performed      Past Medical History:  Diagnosis Date  . Environmental allergies   . Hypercholesteremia   . Hypertension     Past Surgical History:  Procedure Laterality Date  . ABDOMINAL HYSTERECTOMY    . TONSILLECTOMY      There were no vitals filed for this visit.      Subjective Assessment - 02/16/16 0805    Subjective Better today 3/10 neck and back   Currently in Pain? Yes   Pain Score 3    Pain Location Neck   Pain Orientation Left   Pain Score 3   Pain Location Back   Pain Orientation Left                         [facility] Adult PT Treatment/Exercise - 02/16/16 0001      Lumbar Exercises: Aerobic   Stationary Bike Nustep L4 x 6 min UE/LE cue for posture and shouders relaxed     Ultrasound   Ultrasound Location LT neck and lower back   Ultrasound Parameters 100% 1.6 Wcm2, 1MHz   Ultrasound Goals Pain     Manual Therapy   Soft tissue mobilization Lt rhomboid, upper, mid traps, levator, with rock tool, ande LT lower back                  PT Short Term Goals - 02/14/16 1236      PT SHORT TERM GOAL #1   Title she will be independent with inital HEP   Status Achieved     PT SHORT TERM GOAL #2   Title She will report pain in Lt neck decr 40%   Status Achieved     PT SHORT TERM GOAL #3   Title She will report pain in back decr 40%   Status Achieved     PT SHORT TERM GOAL #4   Title She will report no LT arm symptoms   Status On-going     PT SHORT TERM GOAL #5   Title She will be able to   sit and stand for 20 min or more without increased pain   Baseline 45 min sitting , stand at least 20 min   Status Achieved           PT Long Term Goals - 02/14/16 1237      PT LONG TERM GOAL #1   Title She will be independent with all HEP issued   Status On-going     PT LONG TERM GOAL #2   Title She will report 75% or more decreased neck pain and will be able to look over shoulder with  no pain.   Baseline pain mostly to LT    Status Partially Met  PT LONG TERM GOAL #3   Title She will report no back pain with home activties and will able to clean bathroom without pain more than 1-2/10   Baseline Shew does sopme light to moderate cleaning but no sweep/mopping     PT LONG TERM GOAL #4   Title She will be able to mop with 1-2/10 neck and back pain   Status On-going     PT LONG TERM GOAL #5   Title she ,with support, will be able to sit as needed for activity with 1-2/10 max pain   Baseline pain levels > 2/10   Status On-going     PT LONG TERM GOAL #6   Title She will report able to do normal shopping with 1-2 max pain   Baseline has not done yet.    Status On-going               Plan - 02/16/16 8309    Clinical Impression Statement SLight decr pain . Movement patterns still smooth and normal wa;king /getting out of chair and neck movements moving head to look into purse and turning to look over shoulder. . continues with 2 areas of pain .    PT Treatment/Interventions Electrical Stimulation;Moist Heat;Traction;Ultrasound;Passive range of motion;Patient/family education;Taping;Manual techniques;Therapeutic exercise;Dry needling   PT Next Visit Plan continue manual, dry needle   hip mobs, add more stab?  Tracton helpful repeat   PT  Home Exercise Plan scapula retraction, knee to chest, neck rotation nad sidebend stretch, posterior pelvic tilt 911/04/2015)  supine ER, yellow band, red band row   Consulted and Agree with Plan of Care Patient      Patient will benefit from skilled therapeutic intervention in order to improve the following deficits and impairments:  Pain, Increased muscle spasms, Decreased activity tolerance, Decreased range of motion  Visit Diagnosis: Cervicalgia  Acute bilateral low back pain without sciatica  Acute pain of left shoulder  Pain in left leg     Problem List There are no active problems to display for this patient.   Darrel Hoover   PT 02/16/2016, 8:43 AM  [facility] [address] [place], [place], [address] Phone: [phone]   Fax:  [phone]  Name: REGNIA MATHWIG MRN: [id number] Date of Birth: 1940/12/25

## 2016-02-21 ENCOUNTER — Ambulatory Visit: Payer: Medicare Other

## 2016-02-21 DIAGNOSIS — M545 Low back pain, unspecified: Secondary | ICD-10-CM

## 2016-02-21 DIAGNOSIS — M79605 Pain in left leg: Secondary | ICD-10-CM

## 2016-02-21 DIAGNOSIS — M542 Cervicalgia: Secondary | ICD-10-CM

## 2016-02-21 DIAGNOSIS — M25512 Pain in left shoulder: Secondary | ICD-10-CM

## 2016-02-21 DIAGNOSIS — R262 Difficulty in walking, not elsewhere classified: Secondary | ICD-10-CM

## 2016-02-21 NOTE — Therapy (Signed)
[place], [place], [address] Phone: [phone]   Fax:  [phone]  Physical Therapy Treatment  Patient Details  Name: Deborah Stein MRN: [id number] Date of Birth: May 21, 1940 Referring Provider:  Lavone Orn , MD  Encounter Date: 02/21/2016      PT End of Session - 02/21/16 0801    Visit Number 16   Number of Visits 20   Date for PT Re-Evaluation 03/03/16   Authorization Type UHR MCR   PT Start Time 0800   PT Stop Time 0850   PT Time Calculation (min) 50 min   Activity Tolerance Patient tolerated treatment well;No increased pain   Behavior During Therapy WFL for tasks assessed/performed      Past Medical History:  Diagnosis Date  . Environmental allergies   . Hypercholesteremia   . Hypertension     Past Surgical History:  Procedure Laterality Date  . ABDOMINAL HYSTERECTOMY    . TONSILLECTOMY      There were no vitals filed for this visit.      Subjective Assessment - 02/21/16 0804    Subjective Did alot this week with 3 trips to church and today back 4 neck 3/10. Pain also in Lt knee   Currently in Pain? Yes   Pain Score 3    Pain Location Neck   Pain Orientation Left   Pain Descriptors / Indicators Sore;Tender   Pain Type Chronic pain   Pain Onset More than a month ago   Pain Frequency Constant   Aggravating Factors  turning head    Pain Relieving Factors meds , position changes   Pain Score 4   Pain Location Back   Pain Orientation Left   Pain Descriptors / Indicators Aching   Pain Type Chronic pain   Pain Radiating Towards Lt shoulder   Pain Onset More than a month ago   Pain Frequency Constant   Aggravating Factors  sit toolong   Pain Relieving Factors meds , positon changes                         [facility] Adult PT Treatment/Exercise - 02/21/16 0001      Lumbar Exercises: Aerobic   Stationary Bike Nustep L6 x 6 min UE/LE cue for posture and  shouders relaxed     Moist Heat Therapy   Number Minutes Moist Heat 15 Minutes   Moist Heat Location Cervical;Lumbar Spine     Ultrasound   Ultrasound Location LT neck and  lower back   Ultrasound Parameters 100% 1.6 Wcm2 ! Hz   Ultrasound Goals Pain     Manual Therapy   Soft tissue mobilization Lt rhomboid, upper, mid traps, levator, with rock tool, ande LT lower back   Passive ROM cervical Rotation and Side bend RT and LT 2 x 20 sec.   2x25 sec QL stretch                   PT Short Term Goals - 02/14/16 1236      PT SHORT TERM GOAL #1   Title she will be independent with inital HEP   Status Achieved     PT SHORT TERM GOAL #2   Title She will report pain in Lt neck decr 40%   Status Achieved     PT SHORT TERM GOAL #3   Title She will report pain in back decr 40%   Status Achieved  PT SHORT TERM GOAL #4   Title She will report no LT arm symptoms   Status On-going     PT SHORT TERM GOAL #5   Title She will be able to   sit and stand for 20 min or more without increased pain   Baseline 45 min sitting , stand at least 20 min   Status Achieved           PT Long Term Goals - 02/14/16 1237      PT LONG TERM GOAL #1   Title She will be independent with all HEP issued   Status On-going     PT LONG TERM GOAL #2   Title She will report 75% or more decreased neck pain and will be able to look over shoulder with  no pain.   Baseline pain mostly to LT    Status Partially Met     PT LONG TERM GOAL #3   Title She will report no back pain with home activties and will able to clean bathroom without pain more than 1-2/10   Baseline Shew does sopme light to moderate cleaning but no sweep/mopping     PT LONG TERM GOAL #4   Title She will be able to mop with 1-2/10 neck and back pain   Status On-going     PT LONG TERM GOAL #5   Title she ,with support, will be able to sit as needed for activity with 1-2/10 max pain   Baseline pain levels > 2/10   Status  On-going     PT LONG TERM GOAL #6   Title She will report able to do normal shopping with 1-2 max pain   Baseline has not done yet.    Status On-going               Plan - 02/21/16 0801    Clinical Impression Statement No changes in pain . STW to LT neck eased neck and feeling of discomfort down  LT arm. She will have needling next visit and if this does not help she will need to return to MD  PAssive Cervical motion noral.   PT Treatment/Interventions Electrical Stimulation;Moist Heat;Traction;Ultrasound;Passive range of motion;Patient/family education;Taping;Manual techniques;Therapeutic exercise;Dry needling   PT Next Visit Plan continue manual, dry needle,    hip mobs, add more stab   PT Home Exercise Plan scapula retraction, knee to chest, neck rotation nad sidebend stretch, posterior pelvic tilt 911/04/2015)  supine ER, yellow band, red band row   Consulted and Agree with Plan of Care Patient      Patient will benefit from skilled therapeutic intervention in order to improve the following deficits and impairments:  Pain, Increased muscle spasms, Decreased activity tolerance, Decreased range of motion  Visit Diagnosis: Cervicalgia  Acute bilateral low back pain without sciatica  Acute pain of left shoulder  Pain in left leg  Difficulty in walking, not elsewhere classified     Problem List There are no active problems to display for this patient.   Darrel Hoover PT 02/21/2016, 8:51 AM  [facility] [address] [place], [place], [address] Phone: [phone]   Fax:  [phone]  Name: Deborah Stein MRN: [id number] Date of Birth: 1940/04/16

## 2016-02-23 ENCOUNTER — Ambulatory Visit: Payer: Medicare Other | Admitting: Physical Therapy

## 2016-02-23 DIAGNOSIS — M542 Cervicalgia: Secondary | ICD-10-CM | POA: Diagnosis not present

## 2016-02-23 DIAGNOSIS — R262 Difficulty in walking, not elsewhere classified: Secondary | ICD-10-CM

## 2016-02-23 DIAGNOSIS — M545 Low back pain, unspecified: Secondary | ICD-10-CM

## 2016-02-23 DIAGNOSIS — M25512 Pain in left shoulder: Secondary | ICD-10-CM

## 2016-02-23 DIAGNOSIS — M79605 Pain in left leg: Secondary | ICD-10-CM

## 2016-02-23 NOTE — Therapy (Signed)
[place], [place], [address] Phone: [phone]   Fax:  [phone]  Physical Therapy Treatment  Patient Details  Name: Deborah Stein MRN: [id number] Date of Birth: Mar 05, 1941 Referring Provider:  Lavone Orn , MD  Encounter Date: 02/23/2016      PT End of Session - 02/23/16 0913    Visit Number 17   Number of Visits 20   Date for PT Re-Evaluation 03/03/16   Authorization Type UHR MCR   PT Start Time 0801   PT Stop Time 0900   PT Time Calculation (min) 59 min   Activity Tolerance Patient tolerated treatment well   Behavior During Therapy [facility] for tasks assessed/performed      Past Medical History:  Diagnosis Date  . Environmental allergies   . Hypercholesteremia   . Hypertension     Past Surgical History:  Procedure Laterality Date  . ABDOMINAL HYSTERECTOMY    . TONSILLECTOMY      There were no vitals filed for this visit.      Subjective Assessment - 02/23/16 0807    Subjective "My pain is about a 3/10 in the neck and low back, I think it is related to weather"    Currently in Pain? Yes   Pain Score 3    Pain Location Neck   Pain Orientation Left   Pain Descriptors / Indicators Sore;Tender   Pain Type Chronic pain   Pain Onset More than a month ago   Pain Frequency Intermittent   Aggravating Factors  turning the head   Pain Relieving Factors meds, changing position   Pain Score 3   Pain Location Back   Pain Orientation Left   Pain Descriptors / Indicators Aching   Pain Type Chronic pain   Pain Onset More than a month ago   Pain Frequency Intermittent   Aggravating Factors  prolonged sitting   Pain Relieving Factors meds, position changes.                          [place] Adult PT Treatment/Exercise - 02/23/16 0001      Lumbar Exercises: Stretches   Active Hamstring Stretch 3 reps;30 seconds  PNF contract/ relax with 10 sec contraction     Lumbar  Exercises: Supine   Other Supine Lumbar Exercises SLR on the LLE 2 x 15  given as HEP     Moist Heat Therapy   Number Minutes Moist Heat 10 Minutes   Moist Heat Location Cervical  in supine     Manual Therapy   Manual Therapy Muscle Energy Technique;Myofascial release   Soft tissue mobilization IAStM over L upper trap    Myofascial Release DTM over the L upper trap   Muscle Energy Technique scissor technique with L hip hamstring curl and R hip flexion.      Neck Exercises: Stretches   Upper Trapezius Stretch 2 reps;30 seconds   Levator Stretch 2 reps;30 seconds          Trigger Point Dry Needling - 02/23/16 0932    Consent Given? Yes   Education Handout Provided Yes   Muscles Treated Upper Body Upper trapezius   Upper Trapezius Response Twitch reponse elicited;Palpable increased muscle length  on L only              PT Education - 02/23/16 0911    Education provided Yes   Education Details anatomy of the hips regarding innominate rotation  and effects of surrounding musculature as well as formation of trigger points and benefits for TPDN, what to expect and after care. Updated hep for innominate posterior rotation with hamstring stretching and hip flexor strengthening.     Person(s) Educated Patient   Methods Explanation;Verbal cues;Handout   Comprehension Verbalized understanding;Verbal cues required          PT Short Term Goals - 02/14/16 1236      PT SHORT TERM GOAL #1   Title she will be independent with inital HEP   Status Achieved     PT SHORT TERM GOAL #2   Title She will report pain in Lt neck decr 40%   Status Achieved     PT SHORT TERM GOAL #3   Title She will report pain in back decr 40%   Status Achieved     PT SHORT TERM GOAL #4   Title She will report no LT arm symptoms   Status On-going     PT SHORT TERM GOAL #5   Title She will be able to   sit and stand for 20 min or more without increased pain   Baseline 45 min sitting , stand at  least 20 min   Status Achieved           PT Long Term Goals - 02/14/16 1237      PT LONG TERM GOAL #1   Title She will be independent with all HEP issued   Status On-going     PT LONG TERM GOAL #2   Title She will report 75% or more decreased neck pain and will be able to look over shoulder with  no pain.   Baseline pain mostly to LT    Status Partially Met     PT LONG TERM GOAL #3   Title She will report no back pain with home activties and will able to clean bathroom without pain more than 1-2/10   Baseline Shew does sopme light to moderate cleaning but no sweep/mopping     PT LONG TERM GOAL #4   Title She will be able to mop with 1-2/10 neck and back pain   Status On-going     PT LONG TERM GOAL #5   Title she ,with support, will be able to sit as needed for activity with 1-2/10 max pain   Baseline pain levels > 2/10   Status On-going     PT LONG TERM GOAL #6   Title She will report able to do normal shopping with 1-2 max pain   Baseline has not done yet.    Status On-going               Plan - 02/23/16 0913    Clinical Impression Statement pt reportes only 3/10 pain today in the neck and back. further assessment revealed an anterior innominate rotation deficit on the LLE. Following hamstring stretching and MET of the L hip flexor she reported pain at a .5/10. DN was perfomed on the L upper trap which she demonstrated significant twitches. performed soft tissue work post [employer]. utilized MHP for soreness in the L upper trap   PT Next Visit Plan assess response to DN, continue manual,    hip mobs, add more stab, posterior innominate rotation on the L: hip flexor strengthening, hamstring stretch   PT Home Exercise Plan scapula retraction, knee to chest, neck rotation nad sidebend stretch, posterior pelvic tilt 911/04/2015)  supine ER, yellow band, red band row, hamstring stretching,  SLR   Consulted and Agree with Plan of Care Patient      Patient will benefit from  skilled therapeutic intervention in order to improve the following deficits and impairments:  Pain, Increased muscle spasms, Decreased activity tolerance, Decreased range of motion  Visit Diagnosis: Cervicalgia  Acute bilateral low back pain without sciatica  Acute pain of left shoulder  Pain in left leg  Difficulty in walking, not elsewhere classified     Problem List There are no active problems to display for this patient.  Starr Lake PT, DPT, LAT, ATC  02/23/16  10:18 AM      [facility] [address] [place], [place], [address] Phone: [phone]   Fax:  [phone]  Name: Deborah Stein MRN: [id number] Date of Birth: 21-Jun-1940

## 2016-03-02 ENCOUNTER — Ambulatory Visit: Payer: Medicare Other

## 2016-03-02 DIAGNOSIS — M542 Cervicalgia: Secondary | ICD-10-CM

## 2016-03-02 DIAGNOSIS — M79605 Pain in left leg: Secondary | ICD-10-CM

## 2016-03-02 DIAGNOSIS — M25512 Pain in left shoulder: Secondary | ICD-10-CM

## 2016-03-02 DIAGNOSIS — M545 Low back pain, unspecified: Secondary | ICD-10-CM

## 2016-03-02 NOTE — Therapy (Signed)
[place], [place], [address] Phone: [phone]   Fax:  [phone]  Physical Therapy Treatment  Patient Details  Name: Deborah Stein MRN: [id number] Date of Birth: 1941-03-04 Referring Provider:  Lavone Orn , MD  Encounter Date: 03/02/2016      PT End of Session - 03/02/16 0800    Visit Number 18   Number of Visits 20   Date for PT Re-Evaluation 03/10/16   Authorization Type UHR MCR   PT Start Time 0800   PT Stop Time 0850   PT Time Calculation (min) 50 min   Activity Tolerance Patient tolerated treatment well   Behavior During Therapy [facility] for tasks assessed/performed      Past Medical History:  Diagnosis Date  . Environmental allergies   . Hypercholesteremia   . Hypertension     Past Surgical History:  Procedure Laterality Date  . ABDOMINAL HYSTERECTOMY    . TONSILLECTOMY      There were no vitals filed for this visit.      Subjective Assessment - 03/02/16 0801    Subjective Needle hurt but felt better after. Pain less now 2/10 back and 1/10 LT neck   Currently in Pain? Yes   Pain Score 1    Pain Location Neck   Pain Orientation Left   Pain Type Chronic pain   Pain Onset More than a month ago   Pain Frequency Intermittent   Aggravating Factors  turning head   Pain Relieving Factors meds, position changes   Pain Score 2   Pain Location Back   Pain Orientation Left   Pain Descriptors / Indicators Aching   Pain Type Chronic pain   Pain Onset More than a month ago   Pain Frequency Intermittent   Aggravating Factors  sitting   Pain Relieving Factors meds , positon changing                         [facility] Adult PT Treatment/Exercise - 03/02/16 0001      Lumbar Exercises: Stretches   Double Knee to Chest Stretch 1 rep;60 seconds   Piriformis Stretch 1 rep;60 seconds     Lumbar Exercises: Aerobic   Stationary Bike Nustep L6 x 6 min UE/LE cue for posture  and shouders relaxed     Lumbar Exercises: Prone   Straight Leg Raise 10 reps  RT and LT      Moist Heat Therapy   Number Minutes Moist Heat 10 Minutes   Moist Heat Location Lumbar Spine;Cervical     Manual Therapy   Soft tissue mobilization IAStM over L upper trap    Myofascial Release DTM over the L upper trap   Passive ROM Hip rotation and extension along with longa xis pull and SLR stretching                   PT Short Term Goals - 02/14/16 1236      PT SHORT TERM GOAL #1   Title she will be independent with inital HEP   Status Achieved     PT SHORT TERM GOAL #2   Title She will report pain in Lt neck decr 40%   Status Achieved     PT SHORT TERM GOAL #3   Title She will report pain in back decr 40%   Status Achieved     PT SHORT TERM GOAL #4   Title She will report no  LT arm symptoms   Status On-going     PT SHORT TERM GOAL #5   Title She will be able to   sit and stand for 20 min or more without increased pain   Baseline 45 min sitting , stand at least 20 min   Status Achieved           PT Long Term Goals - 02/14/16 1237      PT LONG TERM GOAL #1   Title She will be independent with all HEP issued   Status On-going     PT LONG TERM GOAL #2   Title She will report 75% or more decreased neck pain and will be able to look over shoulder with  no pain.   Baseline pain mostly to LT    Status Partially Met     PT LONG TERM GOAL #3   Title She will report no back pain with home activties and will able to clean bathroom without pain more than 1-2/10   Baseline Shew does sopme light to moderate cleaning but no sweep/mopping     PT LONG TERM GOAL #4   Title She will be able to mop with 1-2/10 neck and back pain   Status On-going     PT LONG TERM GOAL #5   Title she ,with support, will be able to sit as needed for activity with 1-2/10 max pain   Baseline pain levels > 2/10   Status On-going     PT LONG TERM GOAL #6   Title She will report able  to do normal shopping with 1-2 max pain   Baseline has not done yet.    Status On-going               Plan - 03/02/16 0800    Clinical Impression Statement Extended plan of care due to benefit from DN and 2 more visits scheduled.   Pain less today than in past.   LT hip with 5-10 degrees less extensin in prone. Continue stretching nad DN . Probable discharge after next 2 visits   PT Treatment/Interventions Electrical Stimulation;Moist Heat;Traction;Ultrasound;Passive range of motion;Patient/family education;Taping;Manual techniques;Therapeutic exercise;Dry needling   PT Next Visit Plan , continue manual,    hip mobs, add more stab, posterior innominate rotation on the L: hip flexor strengthening, hamstring stretch  DN after next 2 visits possible discharge    PT Home Exercise Plan scapula retraction, knee to chest, neck rotation nad sidebend stretch, posterior pelvic tilt 911/04/2015)  supine ER, yellow band, red band row, hamstring stretching, SLR   Consulted and Agree with Plan of Care Patient      Patient will benefit from skilled therapeutic intervention in order to improve the following deficits and impairments:  Pain, Increased muscle spasms, Decreased activity tolerance, Decreased range of motion  Visit Diagnosis: Cervicalgia  Acute bilateral low back pain without sciatica  Acute pain of left shoulder  Pain in left leg     Problem List There are no active problems to display for this patient.   Darrel Hoover  PT 03/02/2016, 8:49 AM  [facility] [address] [place], [place], [address] Phone: [phone]   Fax:  [phone]  Name: Deborah Stein MRN: [id number] Date of Birth: 25-May-1940

## 2016-03-07 ENCOUNTER — Ambulatory Visit: Payer: Medicare Other | Attending: Internal Medicine | Admitting: Physical Therapy

## 2016-03-07 DIAGNOSIS — M25512 Pain in left shoulder: Secondary | ICD-10-CM

## 2016-03-07 DIAGNOSIS — M79605 Pain in left leg: Secondary | ICD-10-CM | POA: Diagnosis present

## 2016-03-07 DIAGNOSIS — R262 Difficulty in walking, not elsewhere classified: Secondary | ICD-10-CM | POA: Diagnosis present

## 2016-03-07 DIAGNOSIS — M545 Low back pain, unspecified: Secondary | ICD-10-CM

## 2016-03-07 DIAGNOSIS — M542 Cervicalgia: Secondary | ICD-10-CM | POA: Diagnosis not present

## 2016-03-07 NOTE — Therapy (Signed)
[place], [place], [address] Phone: [phone]   Fax:  [phone]  Physical Therapy Treatment  Patient Details  Name: Deborah Stein MRN: [id number] Date of Birth: May 07, 1940 Referring Provider:  Lavone Orn , MD  Encounter Date: 03/07/2016      PT End of Session - 03/07/16 0932    Visit Number 19   Number of Visits 20   Date for PT Re-Evaluation 03/10/16   Authorization Type UHR MCR   PT Start Time 0849   PT Stop Time 0932   PT Time Calculation (min) 43 min   Activity Tolerance Patient tolerated treatment well   Behavior During Therapy [facility] for tasks assessed/performed      Past Medical History:  Diagnosis Date  . Environmental allergies   . Hypercholesteremia   . Hypertension     Past Surgical History:  Procedure Laterality Date  . ABDOMINAL HYSTERECTOMY    . TONSILLECTOMY      There were no vitals filed for this visit.      Subjective Assessment - 03/07/16 0851    Subjective "I am feeling pretty good"    Currently in Pain? Yes   Pain Score 1    Pain Location Neck   Pain Orientation Left   Pain Onset More than a month ago   Pain Frequency Intermittent   Pain Score 2   Pain Location Back   Pain Orientation Left   Pain Type Chronic pain   Pain Onset More than a month ago   Pain Frequency Intermittent                         [facility] Adult PT Treatment/Exercise - 03/07/16 0001      Lumbar Exercises: Aerobic   Stationary Bike Nustep L5 x 5 min, UE/LE     Lumbar Exercises: Supine   Other Supine Lumbar Exercises SLR on the LLE 2 x 15   Other Supine Lumbar Exercises dead bug 3 x 15 sec     Manual Therapy   Manual Therapy Taping   Soft tissue mobilization IAStM over L upper trap    Myofascial Release DTM over the L upper trap   Muscle Energy Technique scissor technique with L hip hamstring curl and R hip flexion.   how to perform at home with dowel rod   McConnell L upper trap inhibition taping           Trigger Point Dry Needling - 03/07/16 0917    Consent Given? Yes   Education Handout Provided Yes  given previously   Upper Trapezius Response Twitch reponse elicited;Palpable increased muscle length              PT Education - 03/07/16 0931    Education provided Yes   Education Details benefits of inhibition taping and length of wear.   Person(s) Educated Patient   Methods Explanation;Verbal cues   Comprehension Verbalized understanding;Verbal cues required          PT Short Term Goals - 02/14/16 1236      PT SHORT TERM GOAL #1   Title she will be independent with inital HEP   Status Achieved     PT SHORT TERM GOAL #2   Title She will report pain in Lt neck decr 40%   Status Achieved     PT SHORT TERM GOAL #3   Title She will report pain in back decr 40%  Status Achieved     PT SHORT TERM GOAL #4   Title She will report no LT arm symptoms   Status On-going     PT SHORT TERM GOAL #5   Title She will be able to   sit and stand for 20 min or more without increased pain   Baseline 45 min sitting , stand at least 20 min   Status Achieved           PT Long Term Goals - 02/14/16 1237      PT LONG TERM GOAL #1   Title She will be independent with all HEP issued   Status On-going     PT LONG TERM GOAL #2   Title She will report 75% or more decreased neck pain and will be able to look over shoulder with  no pain.   Baseline pain mostly to LT    Status Partially Met     PT LONG TERM GOAL #3   Title She will report no back pain with home activties and will able to clean bathroom without pain more than 1-2/10   Baseline Shew does sopme light to moderate cleaning but no sweep/mopping     PT LONG TERM GOAL #4   Title She will be able to mop with 1-2/10 neck and back pain   Status On-going     PT LONG TERM GOAL #5   Title she ,with support, will be able to sit as needed for activity with 1-2/10 max  pain   Baseline pain levels > 2/10   Status On-going     PT LONG TERM GOAL #6   Title She will report able to do normal shopping with 1-2 max pain   Baseline has not done yet.    Status On-going               Plan - 03/07/16 1002    Clinical Impression Statement she continues to report improvement in pain in the neck and low back. Continued DN over the L upper trap and followed with soft tissue work. continued work for L hip posterior rotation which she reported pain dropped to less than 1/10 in the back an dneck.    PT Next Visit Plan assess response to DN and taping,    hip mobs, add more stab, posterior innominate rotation on the L: hip flexor strengthening, D/C?   PT Home Exercise Plan scapula retraction, knee to chest, neck rotation nad sidebend stretch, posterior pelvic tilt 911/04/2015)  supine ER, yellow band, red band row, hamstring stretching, SLR,    Consulted and Agree with Plan of Care Patient      Patient will benefit from skilled therapeutic intervention in order to improve the following deficits and impairments:  Pain, Increased muscle spasms, Decreased activity tolerance, Decreased range of motion  Visit Diagnosis: Cervicalgia  Acute bilateral low back pain without sciatica  Acute pain of left shoulder  Pain in left leg  Difficulty in walking, not elsewhere classified     Problem List There are no active problems to display for this patient.  Starr Lake PT, DPT, LAT, ATC  03/07/16  10:07 AM      [facility] [address] [place], [place], [address] Phone: [phone]   Fax:  [phone]  Name: Deborah Stein MRN: [id number] Date of Birth: May 20, 1940

## 2016-03-09 ENCOUNTER — Ambulatory Visit: Payer: Medicare Other | Admitting: Physical Therapy

## 2016-03-09 DIAGNOSIS — M542 Cervicalgia: Secondary | ICD-10-CM | POA: Diagnosis not present

## 2016-03-09 DIAGNOSIS — M25512 Pain in left shoulder: Secondary | ICD-10-CM

## 2016-03-09 DIAGNOSIS — R262 Difficulty in walking, not elsewhere classified: Secondary | ICD-10-CM

## 2016-03-09 DIAGNOSIS — M545 Low back pain, unspecified: Secondary | ICD-10-CM

## 2016-03-09 DIAGNOSIS — M79605 Pain in left leg: Secondary | ICD-10-CM

## 2016-03-09 NOTE — Therapy (Signed)
[place], [place], [address] Phone: [phone]   Fax:  [phone]  Physical Therapy Treatment / Discharge Note  Patient Details  Name: Deborah Stein MRN: [id number] Date of Birth: 07-14-1940 Referring Provider:  Lavone Stein , MD  Encounter Date: 03/09/2016      PT End of Session - 03/09/16 1259    Visit Number 20   Number of Visits 20   Date for PT Re-Evaluation 03/10/16   PT Start Time 1016   PT Stop Time 1102   PT Time Calculation (min) 46 min   Activity Tolerance Patient tolerated treatment well   Behavior During Therapy [facility] Stein for tasks assessed/performed      Past Medical History:  Diagnosis Date  . Environmental allergies   . Hypercholesteremia   . Hypertension     Past Surgical History:  Procedure Laterality Date  . ABDOMINAL HYSTERECTOMY    . TONSILLECTOMY      There were no vitals filed for this visit.      Subjective Assessment - 03/09/16 1022    Subjective "I am feeling pretty good, the shoulder is doing well. my low back is still acting up"   Currently in Pain? Yes   Pain Score 1    Pain Location Neck   Pain Orientation Left   Pain Descriptors / Indicators Aching   Pain Type Chronic pain   Pain Onset More than a month ago   Pain Frequency Intermittent   Aggravating Factors  prolonged sitting    Pain Relieving Factors changing position, DN, rubbing the shoulder.    Pain Score 2   Pain Location Back   Pain Orientation Left   Pain Type Chronic pain   Pain Onset More than a month ago   Pain Frequency Intermittent   Aggravating Factors  going out and standing for too long.   Pain Relieving Factors massage, stretching            [facility] Stein PT Assessment - 03/09/16 0001      Observation/Other Assessments   Focus on Therapeutic Outcomes (FOTO)  40% limited     AROM   Cervical Flexion 80   Cervical Extension 55   Cervical - Right Side Bend 60   Cervical - Left Side  Bend 60   Cervical - Right Rotation 70   Cervical - Left Rotation 68   Lumbar Flexion 112   Lumbar Extension 30   Lumbar - Right Side Bend 30   Lumbar - Left Side Bend 30                     [facility] Adult PT Treatment/Exercise - 03/09/16 0001      Lumbar Exercises: Stretches   Active Hamstring Stretch 30 seconds;3 reps  with strap   Pelvic Tilt --  1 x 10 with 3 sec hold     Lumbar Exercises: Aerobic   Stationary Bike Nustep L6 x 6 min with UE/LE     Lumbar Exercises: Supine   Straight Leg Raise 10 reps  2 sets with 3#     Neck Exercises: Stretches   Upper Trapezius Stretch 2 reps;30 seconds   Levator Stretch 2 reps;30 seconds                PT Education - 03/09/16 1302    Education provided Yes   Education Details review previously given HEP, how to progress with increased reps/ sets and benefits for consistency.  Person(s) Educated Patient   Methods Explanation;Verbal cues;Handout   Comprehension Verbalized understanding;Verbal cues required          PT Short Term Goals - 03/09/16 1058      PT SHORT TERM GOAL #1   Title she will be independent with inital HEP   Time 3   Period Weeks   Status Achieved     PT SHORT TERM GOAL #2   Title She will report pain in Lt neck decr 40%   Time 3   Period Weeks   Status Achieved     PT SHORT TERM GOAL #3   Title She will report pain in back decr 40%   Time 3   Period Weeks   Status Achieved     PT SHORT TERM GOAL #4   Title She will report no LT arm symptoms   Time 3   Period Weeks   Status Partially Met     PT SHORT TERM GOAL #5   Title She will be able to   sit and stand for 20 min or more without increased pain   Time 3   Period Weeks   Status Achieved           PT Long Term Goals - 03/09/16 1059      PT LONG TERM GOAL #1   Title She will be independent with all HEP issued   Time 6   Period Weeks   Status Achieved     PT LONG TERM GOAL #2   Title She will report 75% or  more decreased neck pain and will be able to look over shoulder with  no pain.   Period Weeks   Status Achieved     PT LONG TERM GOAL #3   Title She will report no back pain with home activties and will able to clean bathroom without pain more than 1-2/10   Time 6   Period Weeks   Status Achieved     PT LONG TERM GOAL #4   Title She will be able to mop with 1-2/10 neck and back pain   Time 6   Status Partially Met     PT LONG TERM GOAL #5   Title she ,with support, will be able to sit as needed for activity with 1-2/10 max pain   Baseline 1/10 today   Time 6   Period Weeks   Status Achieved     PT LONG TERM GOAL #6   Title She will report able to do normal shopping with 1-2 max pain   Time 6   Period Weeks   Status Achieved               Plan - 03/09/16 1259    Clinical Impression Statement [name] has made significant improvement in cervical and trunk mobility with minimal pain rated at 1-2/10. She was able to do all exercises and stretches which she reported pain dropped to to 0-1/10. She has met or partially met all goals.  based on her functional measures, FOTO and meeting her goals she is able to maintain and progress her current level of funciton independently and will be discharged from PT today.    PT Next Visit Plan D/C today   PT Home Exercise Plan scapula retraction, knee to chest, neck rotation nad sidebend stretch, posterior pelvic tilt 911/04/2015)  supine ER, yellow band, red band row, hamstring stretching, SLR,    Consulted and Agree with Plan of Care Patient  Patient will benefit from skilled therapeutic intervention in order to improve the following deficits and impairments:  Pain, Increased muscle spasms, Decreased activity tolerance, Decreased range of motion  Visit Diagnosis: Cervicalgia  Acute bilateral low back pain without sciatica  Acute pain of left shoulder  Pain in left leg  Difficulty in walking, not elsewhere  classified       G-Codes - Mar 23, 2016 1302    Functional Assessment Tool Used FOTO / clinical judgement   Functional Limitation Other PT primary   Other PT Primary Goal Status (V7034) At least 1 percent but less than 20 percent impaired, limited or restricted   Other PT Primary Discharge Status (K3524) At least 1 percent but less than 20 percent impaired, limited or restricted      Problem List There are no active problems to display for this patient.  Starr Lake PT, DPT, LAT, ATC  23-Mar-2016  1:03 PM      [facility] [address] [place], [place], [address] Phone: [phone]   Fax:  [phone]  Name: Deborah Stein MRN: [id number] Date of Birth: Jun 16, 1940      PHYSICAL THERAPY DISCHARGE SUMMARY  Visits from Start of Care: 20  Current functional level related to goals / functional outcomes: See goals   Remaining deficits: Minimal soreness in the Low back and neck that is relieved with stretching and exercise.    Education / Equipment: HEP, posture, theraband, hip biomechanics.  Plan: Patient agrees to discharge.  Patient goals were partially met. Patient is being discharged due to being pleased with the current functional level.  ?????    Sheena Simonis PT, DPT, LAT, ATC  03/23/2016  1:05 PM

## 2016-10-17 ENCOUNTER — Other Ambulatory Visit: Payer: Self-pay | Admitting: Obstetrics & Gynecology

## 2016-10-17 DIAGNOSIS — Z1231 Encounter for screening mammogram for malignant neoplasm of breast: Secondary | ICD-10-CM

## 2016-11-13 ENCOUNTER — Ambulatory Visit
Admission: RE | Admit: 2016-11-13 | Discharge: 2016-11-13 | Disposition: A | Discharge status: Home / Self Care | Payer: Medicare Other | Source: Ambulatory Visit | Attending: Obstetrics & Gynecology | Admitting: Obstetrics & Gynecology

## 2016-11-13 DIAGNOSIS — Z1231 Encounter for screening mammogram for malignant neoplasm of breast: Secondary | ICD-10-CM

## 2017-02-01 ENCOUNTER — Ambulatory Visit: Payer: Medicare Other | Attending: Internal Medicine | Admitting: Physical Therapy

## 2017-02-01 ENCOUNTER — Encounter: Payer: Self-pay | Admitting: Physical Therapy

## 2017-02-01 DIAGNOSIS — M542 Cervicalgia: Secondary | ICD-10-CM | POA: Diagnosis present

## 2017-02-01 DIAGNOSIS — M25512 Pain in left shoulder: Secondary | ICD-10-CM | POA: Diagnosis present

## 2017-02-01 DIAGNOSIS — R262 Difficulty in walking, not elsewhere classified: Secondary | ICD-10-CM | POA: Diagnosis present

## 2017-02-01 DIAGNOSIS — M5442 Lumbago with sciatica, left side: Secondary | ICD-10-CM | POA: Diagnosis present

## 2017-02-01 DIAGNOSIS — G8929 Other chronic pain: Secondary | ICD-10-CM | POA: Diagnosis present
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
## 2017-02-09 ENCOUNTER — Encounter: Payer: Self-pay | Admitting: Physical Therapy

## 2017-02-09 ENCOUNTER — Ambulatory Visit: Payer: Medicare Other | Attending: Internal Medicine | Admitting: Physical Therapy

## 2017-02-09 DIAGNOSIS — M25512 Pain in left shoulder: Secondary | ICD-10-CM | POA: Insufficient documentation

## 2017-02-09 DIAGNOSIS — M545 Low back pain: Secondary | ICD-10-CM | POA: Insufficient documentation

## 2017-02-09 DIAGNOSIS — M542 Cervicalgia: Secondary | ICD-10-CM | POA: Insufficient documentation

## 2017-02-09 DIAGNOSIS — M5442 Lumbago with sciatica, left side: Secondary | ICD-10-CM | POA: Insufficient documentation

## 2017-02-09 DIAGNOSIS — G8929 Other chronic pain: Secondary | ICD-10-CM | POA: Insufficient documentation

## 2017-02-09 DIAGNOSIS — R262 Difficulty in walking, not elsewhere classified: Secondary | ICD-10-CM | POA: Diagnosis present

## 2017-02-09 DIAGNOSIS — M79605 Pain in left leg: Secondary | ICD-10-CM | POA: Insufficient documentation

## 2017-02-09 NOTE — Therapy (Signed)
[facility] [address] [place], [place], [address] Phone: [phone]   Fax:  [phone]  Physical Therapy Treatment  Patient Details  Name: Deborah Stein DOBRATZ MRN: [id number] Date of Birth: 06-Mar-1941 Referring Provider: Dr Danae Chen    Encounter Date: 02/09/2017  PT End of Session - 02/09/17 0933    Visit Number  2    Number of Visits  16    Date for PT Re-Evaluation  03/29/17    PT Start Time  0922    PT Stop Time  1013    PT Time Calculation (min)  51 min       Past Medical History:  Diagnosis Date  . Environmental allergies   . Hypercholesteremia   . Hypertension     Past Surgical History:  Procedure Laterality Date  . ABDOMINAL HYSTERECTOMY    . TONSILLECTOMY      There were no vitals filed for this visit.  Subjective Assessment - 02/09/17 0924    Subjective  Patient reports that her lower back is hurting her today. She is having pain in her shoulder in the mornings. Most of her pain is acrioss her lower back. She is having pain in her lower lateral calf but it has not reached her foot.     How long can you sit comfortably?  about 20 min before she starts feeling it     How long can you stand comfortably?  15-20 minutes before the pain gets too bad     How long can you walk comfortably?  can walk but does not fewel like she can walk too much     Currently in Pain?  Yes    Pain Score  5     Pain Location  Neck    Pain Descriptors / Indicators  Aching    Pain Type  Chronic pain    Pain Radiating Towards  from the base of her skull     Pain Onset  More than a month ago    Pain Frequency  Constant    Aggravating Factors   use of the shoulder     Pain Relieving Factors  rest    Multiple Pain Sites  Yes    Pain Score  7    Pain Location  Back    Pain Orientation  Right;Left;Mid;Lower    Pain Type  Chronic pain    Pain Onset  More than a month ago    Pain Frequency  Constant    Aggravating Factors   did  a lot of activity yesterday     Pain Relieving Factors  rest     Effect of Pain on Daily Activities  difficulty perfroming ADL';s                       [facility] Adult PT Treatment/Exercise - 02/09/17 0001      Lumbar Exercises: Stretches   Passive Hamstring Stretch Limitations  with right only strap 2x20 sec hold     Single Knee to Chest Stretch Limitations  with towel 2x20 sec hold with cuin     Lower Trunk Rotation Limitations  x10       Lumbar Exercises: Standing   Other Standing Lumbar Exercises  reviewd self tennis ball mobilization x3 min       Lumbar Exercises: Supine   Other Supine Lumbar Exercises  ball squeeze x10 with abdominal breathing; Clamshell with abdominal breathing 2x10 green; Supine march with TA  Manual Therapy   Manual Therapy  Soft tissue mobilization;Manual Traction    Manual Traction  manual sub occipital release and gentle traction; LAD of left lower extremity; Soft tissue mobilization to the lumbar spine             PT Education - 02/09/17 0932    Education provided  Yes    Education Details  reviewed exercises     Person(s) Educated  Patient    Methods  Explanation;Tactile cues;Demonstration;Verbal cues    Comprehension  Verbalized understanding;Returned demonstration;Verbal cues required;Tactile cues required       PT Short Term Goals - 02/09/17 1209      PT SHORT TERM GOAL #1   Title  Pt will be independent with inital HEP    Baseline  doing HEP 2 x per day    Time  4    Period  Weeks    Status  On-going      PT SHORT TERM GOAL #2   Title  Patient will increase left hip strength and left shoulder strength to 4+/5     Baseline  50%    Time  4    Period  Weeks    Status  On-going      PT SHORT TERM GOAL #3   Title  Patient will report centralized lower back pain without left radiculopathy    Baseline  50%    Time  4    Period  Weeks    Status  On-going      PT SHORT TERM GOAL #4   Title  She will report no LT  arm symptoms    Baseline  intermittant    Time  3    Period  Weeks    Status  On-going      PT SHORT TERM GOAL #5   Title  She will be able to   sit and stand for 20 min or more without increased pain    Baseline  45 min sitting , stand at least 20 min    Time  3    Period  Weeks    Status  On-going        PT Long Term Goals - 02/01/17 1740      PT LONG TERM GOAL #1   Title  Patient will stand for 45 minutes without self report of pain in order to perfrom ADL's     Time  8    Period  Weeks    Status  New      PT LONG TERM GOAL #2   Title  Patient will demostrate a 41% limitation on FOTO     Time  8    Period  Weeks    Status  New      PT LONG TERM GOAL #3   Title  She will report no back pain with home activties and will able to clean bathroom without pain more than 1-2/10    Time  8    Period  Weeks    Status  New      PT LONG TERM GOAL #4   Title  Patient will increase cervical rotation by 15 degrees in order to improve community safety     Time  8    Period  Weeks    Status  New            Plan - 02/09/17 1205    Clinical Impression Statement  Patient had spasming in the left lumbar  spine and into the left gluteals. She had less pain after treatment. Therapy updated her HEP. She had good technique with ther-ex. Therapy will continue to adbvance activity as tolerated.     PT Next Visit Plan  Patientsa lumbar spine was her biggest issue last visit. Continue to focus on whatever is her biggest proglrm. Consider scpaular strengthening exerciseswith abdominal breathing.     PT Home Exercise Plan  wand flexion; LTR ; single knee to chest with towel     Consulted and Agree with Plan of Care  Patient       Patient will benefit from skilled therapeutic intervention in order to improve the following deficits and impairments:     Visit Diagnosis: Chronic left-sided low back pain with left-sided sciatica  Cervicalgia  Acute pain of left shoulder  Difficulty in  walking, not elsewhere classified     Problem List There are no active problems to display for this patient.   Dessie Comaavid J Martavious Hartel PT DPT  02/09/2017, 12:10 PM  [facility] [address] [place], [place], [address] Phone: [phone]   Fax:  [phone]  Name: Russ HaloDiane M Scriven MRN: [id number] Date of Birth: Nov 17, 1940

## 2017-02-12 ENCOUNTER — Ambulatory Visit: Payer: Medicare Other

## 2017-02-15 ENCOUNTER — Ambulatory Visit: Payer: Medicare Other | Admitting: Physical Therapy

## 2017-02-15 DIAGNOSIS — M79605 Pain in left leg: Secondary | ICD-10-CM

## 2017-02-15 DIAGNOSIS — M545 Low back pain, unspecified: Secondary | ICD-10-CM

## 2017-02-15 DIAGNOSIS — M5442 Lumbago with sciatica, left side: Secondary | ICD-10-CM | POA: Diagnosis not present

## 2017-02-15 DIAGNOSIS — G8929 Other chronic pain: Secondary | ICD-10-CM

## 2017-02-15 DIAGNOSIS — M25512 Pain in left shoulder: Secondary | ICD-10-CM

## 2017-02-15 DIAGNOSIS — M542 Cervicalgia: Secondary | ICD-10-CM

## 2017-02-15 DIAGNOSIS — R262 Difficulty in walking, not elsewhere classified: Secondary | ICD-10-CM

## 2017-02-15 NOTE — Therapy (Signed)
[facility] [address] [place], [place], [address] Phone: [phone]   Fax:  [phone]  Physical Therapy Treatment  Patient Details  Name: Deborah Stein MRN: [id number] Date of Birth: Nov 18, 1940 Referring Provider: Dr Danae Chen    Encounter Date: 02/15/2017  PT End of Session - 02/15/17 0939    Visit Number  3    Number of Visits  16    Date for PT Re-Evaluation  03/29/17    Authorization Time Period  [facility] medicare     PT Start Time  [phone]    PT Stop Time  0940    PT Time Calculation (min)  53 min    Activity Tolerance  Patient tolerated treatment well    Behavior During Therapy  [facility] for tasks assessed/performed       Past Medical History:  Diagnosis Date  . Environmental allergies   . Hypercholesteremia   . Hypertension     Past Surgical History:  Procedure Laterality Date  . ABDOMINAL HYSTERECTOMY    . TONSILLECTOMY      There were no vitals filed for this visit.  Subjective Assessment - 02/15/17 0854    Subjective  Patient fro reports for 2 days after the last treatment she felt like she was going to have to call 911. She reported significant increases in shoulder and back pain.     Limitations  Standing;Lifting    How long can you sit comfortably?  about 20 min before she starts feeling it     How long can you stand comfortably?  15-20 minutes before the pain gets too bad     How long can you walk comfortably?  can walk but does not fewel like she can walk too much     Currently in Pain?  Yes    Pain Score  7     Pain Location  Back    Pain Orientation  Mid;Left    Pain Descriptors / Indicators  Aching    Pain Type  Chronic pain    Pain Onset  More than a month ago    Pain Frequency  Constant    Aggravating Factors   use of the shoulder     Pain Relieving Factors  rest    Multiple Pain Sites  Yes    Pain Score  7    Pain Location  Shoulder    Pain Orientation  Right;Left;Mid    Pain  Descriptors / Indicators  Aching    Pain Type  Chronic pain    Pain Onset  More than a month ago    Pain Frequency  Constant    Aggravating Factors   did a lot of activity yesterday     Pain Relieving Factors  rest     Effect of Pain on Daily Activities  difficulty perfroming                       [facility] Adult PT Treatment/Exercise - 02/15/17 0001      Lumbar Exercises: Stretches   Lower Trunk Rotation Limitations  x10       Lumbar Exercises: Supine   Other Supine Lumbar Exercises  ball squeeze x10 with abdominal breathing; Clamshell with abdominal breathing 2x10 green with TA      Other Supine Lumbar Exercises  posterior pelvic tilt x10 5 sec hold       Shoulder Exercises: Supine   Other Supine Exercises  wand flexion x10  Manual Therapy   Manual Therapy  Soft tissue mobilization;Manual Traction    Manual Traction  manual sub occipital release and gentle traction; LAD of left lower extremity;              PT Education - 02/15/17 0859    Education provided  Yes    Education Details  reviewed symptom mangement and techique with HEP     Person(s) Educated  Patient    Methods  Explanation;Demonstration;Tactile cues;Verbal cues    Comprehension  Verbalized understanding;Returned demonstration;Verbal cues required;Tactile cues required       PT Short Term Goals - 02/09/17 1209      PT SHORT TERM GOAL #1   Title  Pt will be independent with inital HEP    Baseline  doing HEP 2 x per day    Time  4    Period  Weeks    Status  On-going      PT SHORT TERM GOAL #2   Title  Patient will increase left hip strength and left shoulder strength to 4+/5     Baseline  50%    Time  4    Period  Weeks    Status  On-going      PT SHORT TERM GOAL #3   Title  Patient will report centralized lower back pain without left radiculopathy    Baseline  50%    Time  4    Period  Weeks    Status  On-going      PT SHORT TERM GOAL #4   Title  She will report no LT  arm symptoms    Baseline  intermittant    Time  3    Period  Weeks    Status  On-going      PT SHORT TERM GOAL #5   Title  She will be able to   sit and stand for 20 min or more without increased pain    Baseline  45 min sitting , stand at least 20 min    Time  3    Period  Weeks    Status  On-going        PT Long Term Goals - 02/01/17 1740      PT LONG TERM GOAL #1   Title  Patient will stand for 45 minutes without self report of pain in order to perfrom ADL's     Time  8    Period  Weeks    Status  New      PT LONG TERM GOAL #2   Title  Patient will demostrate a 41% limitation on FOTO     Time  8    Period  Weeks    Status  New      PT LONG TERM GOAL #3   Title  She will report no back pain with home activties and will able to clean bathroom without pain more than 1-2/10    Time  8    Period  Weeks    Status  New      PT LONG TERM GOAL #4   Title  Patient will increase cervical rotation by 15 degrees in order to improve community safety     Time  8    Period  Weeks    Status  New            Plan - 02/15/17 0949    Clinical Impression Statement  Patient tolerated treatment well. She had improved range  with treatment. Therapy scaled exercises back to just manual therapy and light range of motion exercises. She was advised if her painreaches a 10/10 again after todays session to call her MD.     Clinical Presentation  Evolving    Clinical Decision Making  Moderate    Rehab Potential  Good    PT Frequency  2x / week    PT Duration  8 weeks    PT Treatment/Interventions  ADLs/Self Care Home Management;Cryotherapy;Electrical Stimulation;Therapeutic exercise;Therapeutic activities;Neuromuscular re-education;Patient/family education;Passive range of motion;Manual techniques;Taping;Dry needling;Moist Heat    PT Next Visit Plan  Patients a lumbar spine was her biggest issue last visit. Continue to focus on whatever is her biggest proglrm. Consider scpaular  strengthening exercises with abdominal breathing. consider supine IR/ER     PT Home Exercise Plan  wand flexion; LTR ; single knee to chest with towel     Consulted and Agree with Plan of Care  Patient       Patient will benefit from skilled therapeutic intervention in order to improve the following deficits and impairments:  Abnormal gait, Pain, Decreased safety awareness, Decreased knowledge of precautions, Decreased endurance, Decreased range of motion, Increased muscle spasms, Postural dysfunction, Decreased mobility  Visit Diagnosis: Chronic left-sided low back pain with left-sided sciatica  Cervicalgia  Acute pain of left shoulder  Difficulty in walking, not elsewhere classified  Acute bilateral low back pain without sciatica  Pain in left leg     Problem List There are no active problems to display for this patient.   Dessie Comaavid J Vivaan Helseth 02/15/2017, 10:02 AM  [facility] [address] [place], [place], [address] Phone: [phone]   Fax:  [phone]  Name: Russ HaloDiane M Enke MRN: [id number] Date of Birth: 11/07/1940

## 2017-02-19 ENCOUNTER — Ambulatory Visit: Payer: Medicare Other

## 2017-02-19 DIAGNOSIS — G8929 Other chronic pain: Secondary | ICD-10-CM

## 2017-02-19 DIAGNOSIS — M5442 Lumbago with sciatica, left side: Secondary | ICD-10-CM | POA: Diagnosis not present

## 2017-02-19 DIAGNOSIS — M545 Low back pain, unspecified: Secondary | ICD-10-CM

## 2017-02-19 DIAGNOSIS — M25512 Pain in left shoulder: Secondary | ICD-10-CM

## 2017-02-19 DIAGNOSIS — M79605 Pain in left leg: Secondary | ICD-10-CM

## 2017-02-19 DIAGNOSIS — R262 Difficulty in walking, not elsewhere classified: Secondary | ICD-10-CM

## 2017-02-19 DIAGNOSIS — M542 Cervicalgia: Secondary | ICD-10-CM

## 2017-02-19 NOTE — Therapy (Signed)
[facility] [address] [place], [place], [address] Phone: [phone]   Fax:  [phone]  Physical Therapy Treatment  Patient Details  Name: Deborah Stein MRN: [id number] Date of Birth: [birth year] Referring Provider: Dr Danae ChenJohn Griffen    Encounter Date: 02/19/2017  PT End of Session - 02/19/17 0856    Visit Number  4    Number of Visits  16    Date for PT Re-Evaluation  03/29/17    Authorization Time Period  [facility]     PT Start Time  [phone]    PT Stop Time  0940    PT Time Calculation (min)  54 min    Activity Tolerance  Patient tolerated treatment well    Behavior During Therapy  [facility] for tasks assessed/performed       Past Medical History:  Diagnosis Date  . Environmental allergies   . Hypercholesteremia   . Hypertension     Past Surgical History:  Procedure Laterality Date  . ABDOMINAL HYSTERECTOMY    . TONSILLECTOMY      There were no vitals filed for this visit.  Subjective Assessment - 02/19/17 0852    Subjective  She had a pretty good week end and went to church.  She has a little bit of pain 5/10 LT lower back and LT traps.   Since last episode pain was intermittant. She reports continued to stretch.       Currently in Pain?  Yes    Pain Score  5     Pain Location  Back    Pain Orientation  Left    Pain Descriptors / Indicators  Aching    Pain Type  Chronic pain    Pain Onset  More than a month ago    Pain Frequency  Constant    Aggravating Factors   sitting for 40 min     Pain Relieving Factors  standing , rest. self massage, stretching    Pain Score  5    Pain Location  Shoulder    Pain Orientation  Left    Pain Descriptors / Indicators  Aching    Pain Type  Chronic pain    Pain Onset  More than a month ago    Pain Frequency  Constant    Aggravating Factors   using arm    Pain Relieving Factors  resty                      [facility] Adult PT Treatment/Exercise - 02/19/17 0001       Lumbar Exercises: Supine   Other Supine Lumbar Exercises  ball squeeze x10 with abdominal breathing; Clamshell with abdominal breathing 2x10 green with TA      Other Supine Lumbar Exercises  posterior pelvic tilt x10 5 sec hold       Shoulder Exercises: Supine   Other Supine Exercises  wand flexion x15      Modalities   Modalities  Moist Heat      Moist Heat Therapy   Number Minutes Moist Heat  15 Minutes    Moist Heat Location  Lumbar Spine;Shoulder      Manual Therapy   Manual Traction  manual sub occipital release and gentle traction; LAD of left lower extremity;   STW to LT lower spine paraspinals. and manual stretch             PT Education - 02/19/17 [phone]    Education provided  Yes    Education Details  discussed cronicity of problems and how to manage with changing stopping position/activity before pain incr.     Person(s) Educated  Patient    Methods  Explanation    Comprehension  Verbalized understanding       PT Short Term Goals - 02/19/17 0932      PT SHORT TERM GOAL #1   Title  Pt will be independent with inital HEP    Status  On-going      PT SHORT TERM GOAL #2   Title  Patient will increase left hip strength and left shoulder strength to 4+/5     Status  Unable to assess      PT SHORT TERM GOAL #3   Title  Patient will report centralized lower back pain without left radiculopathy    Status  On-going      PT SHORT TERM GOAL #4   Title  She will report no LT arm symptoms    Status  On-going      PT SHORT TERM GOAL #5   Title  She will be able to   sit and stand for 20 min or more without increased pain    Status  On-going        PT Long Term Goals - 02/01/17 1740      PT LONG TERM GOAL #1   Title  Patient will stand for 45 minutes without self report of pain in order to perfrom ADL's     Time  8    Period  Weeks    Status  New      PT LONG TERM GOAL #2   Title  Patient will demostrate a 41% limitation on FOTO     Time  8     Period  Weeks    Status  New      PT LONG TERM GOAL #3   Title  She will report no back pain with home activties and will able to clean bathroom without pain more than 1-2/10    Time  8    Period  Weeks    Status  New      PT LONG TERM GOAL #4   Title  Patient will increase cervical rotation by 15 degrees in order to improve community safety     Time  8    Period  Weeks    Status  New            Plan - 02/19/17 0930    Clinical Impression Statement  Doing better now but sont pain. She wonders why pain does not go completly away.     PT Treatment/Interventions  ADLs/Self Care Home Management;Cryotherapy;Electrical Stimulation;Therapeutic exercise;Therapeutic activities;Neuromuscular re-education;Patient/family education;Passive range of motion;Manual techniques;Taping;Dry needling;Moist Heat    PT Next Visit Plan  Continue to focus on whatever is her biggest proglrm. Consider scpaular strengthening exercises with abdominal breathing. consider supine IR/ER  Continue manual and heat. and stab exer. Maybe DN would help lower back and trap spasm    PT Home Exercise Plan  wand flexion; LTR ; single knee to chest with towel     Consulted and Agree with Plan of Care  Patient       Patient will benefit from skilled therapeutic intervention in order to improve the following deficits and impairments:     Visit Diagnosis: Chronic left-sided low back pain with left-sided sciatica  Cervicalgia  Acute pain of left shoulder  Difficulty in walking, not  elsewhere classified  Acute bilateral low back pain without sciatica  Pain in left leg     Problem List There are no active problems to display for this patient.   Caprice Red  PT 02/19/2017, 9:33 AM  [facility] [address] [place], [place], [address] Phone: [phone]   Fax:  [phone]  Name: Deborah Stein MRN: [id number] Date of Birth: 1940-03-10

## 2017-02-21 ENCOUNTER — Encounter: Payer: Self-pay | Admitting: Physical Therapy

## 2017-02-21 ENCOUNTER — Ambulatory Visit: Payer: Medicare Other | Admitting: Physical Therapy

## 2017-02-21 DIAGNOSIS — M25512 Pain in left shoulder: Secondary | ICD-10-CM

## 2017-02-21 DIAGNOSIS — G8929 Other chronic pain: Secondary | ICD-10-CM

## 2017-02-21 DIAGNOSIS — R262 Difficulty in walking, not elsewhere classified: Secondary | ICD-10-CM

## 2017-02-21 DIAGNOSIS — M5442 Lumbago with sciatica, left side: Secondary | ICD-10-CM | POA: Diagnosis not present

## 2017-02-21 DIAGNOSIS — M542 Cervicalgia: Secondary | ICD-10-CM

## 2017-02-22 NOTE — Therapy (Signed)
[facility] [address] [place], [place], [address] Phone: [phone]   Fax:  [phone]  Physical Therapy Treatment  Patient Details  Name: Deborah HaloDiane M Fedie MRN: [id number] Date of Birth: March 21, 1940 Referring Provider: Dr Danae ChenJohn Griffen    Encounter Date: 02/21/2017  PT End of Session - 02/22/17 0833    Visit Number  5    Number of Visits  16    Date for PT Re-Evaluation  03/29/17    Authorization Time Period  [employer] medicare     PT Start Time  1100    PT Stop Time  1141    PT Time Calculation (min)  41 min    Activity Tolerance  Patient tolerated treatment well    Behavior During Therapy  [facility] for tasks assessed/performed       Past Medical History:  Diagnosis Date  . Environmental allergies   . Hypercholesteremia   . Hypertension     Past Surgical History:  Procedure Laterality Date  . ABDOMINAL HYSTERECTOMY    . TONSILLECTOMY      There were no vitals filed for this visit.  Subjective Assessment - 02/21/17 1106    Subjective  Patient is having burning into her left leg. She did a lot ofwalking yesterday.     Limitations  Standing;Lifting    How long can you sit comfortably?  about 20 min before she starts feeling it     How long can you stand comfortably?  15-20 minutes before the pain gets too bad     How long can you walk comfortably?  can walk but does not fewel like she can walk too much     Patient Stated Goals  difficulty sitting for long periods of time     Currently in Pain?  Yes    Pain Location  Back    Pain Orientation  Left    Pain Descriptors / Indicators  Aching    Pain Type  Chronic pain    Pain Onset  More than a month ago    Pain Frequency  Constant    Aggravating Factors   sitting for too long     Pain Relieving Factors  standing and rest     Multiple Pain Sites  Yes    Pain Score  4    Pain Location  Neck    Pain Orientation  Left    Pain Type  Chronic pain    Pain Onset  More than a  month ago    Pain Frequency  Constant    Aggravating Factors   using the arm     Pain Relieving Factors  rest     Effect of Pain on Daily Activities  difficulty perfroming daily activity                       [facility] Adult PT Treatment/Exercise - 02/22/17 0001      Lumbar Exercises: Stretches   Lower Trunk Rotation Limitations  x10       Lumbar Exercises: Supine   Other Supine Lumbar Exercises  ball squeeze x10 with abdominal breathing; Clamshell with abdominal breathing 2x10 green with TA      Other Supine Lumbar Exercises  posterior pelvic tilt x10 5 sec hold       Shoulder Exercises: Supine   Other Supine Exercises  wand flexion x15      Moist Heat Therapy   Number Minutes Moist Heat  15 Minutes  Moist Heat Location  Lumbar Spine;Shoulder      Manual Therapy   Manual Traction  manual sub occipital release and gentle traction; LAD of left lower extremity;   STW to LT lower spine paraspinals. and manual stretch Posterior hip mob. Improved pain free hip flexion.              PT Education - 02/21/17 1109    Education provided  Yes    Education Details  reviewed tehcnique with exercises     Person(s) Educated  Patient    Methods  Explanation;Demonstration;Tactile cues;Verbal cues    Comprehension  Verbalized understanding;Returned demonstration;Verbal cues required;Tactile cues required       PT Short Term Goals - 02/19/17 0932      PT SHORT TERM GOAL #1   Title  Pt will be independent with inital HEP    Status  On-going      PT SHORT TERM GOAL #2   Title  Patient will increase left hip strength and left shoulder strength to 4+/5     Status  Unable to assess      PT SHORT TERM GOAL #3   Title  Patient will report centralized lower back pain without left radiculopathy    Status  On-going      PT SHORT TERM GOAL #4   Title  She will report no LT arm symptoms    Status  On-going      PT SHORT TERM GOAL #5   Title  She will be able to   sit and  stand for 20 min or more without increased pain    Status  On-going        PT Long Term Goals - 02/01/17 1740      PT LONG TERM GOAL #1   Title  Patient will stand for 45 minutes without self report of pain in order to perfrom ADL's     Time  8    Period  Weeks    Status  New      PT LONG TERM GOAL #2   Title  Patient will demostrate a 41% limitation on FOTO     Time  8    Period  Weeks    Status  New      PT LONG TERM GOAL #3   Title  She will report no back pain with home activties and will able to clean bathroom without pain more than 1-2/10    Time  8    Period  Weeks    Status  New      PT LONG TERM GOAL #4   Title  Patient will increase cervical rotation by 15 degrees in order to improve community safety     Time  8    Period  Weeks    Status  New            Plan - 02/22/17 1610    Clinical Impression Statement  Patient tolerated treatment well today. She had no increase in pain with light exercises. She had complained of pain when sitting at church. Therapy checked her left hip flexion and she had limitations and pain at about 80 degrees. Therapy perfromed a psoterior hip mobilization and her hip flexion improve to 110 degrees. Patient had spasming on both side of her neck today with palpation.     Clinical Presentation  Evolving    Clinical Presentation due to:  increasing pain over the past few weeks  Clinical Decision Making  Moderate    Rehab Potential  Good    PT Frequency  2x / week    PT Duration  8 weeks    PT Treatment/Interventions  ADLs/Self Care Home Management;Cryotherapy;Electrical Stimulation;Therapeutic exercise;Therapeutic activities;Neuromuscular re-education;Patient/family education;Passive range of motion;Manual techniques;Taping;Dry needling;Moist Heat    PT Next Visit Plan  Continue to focus on whatever is her biggest proglrm. Consider scpaular strengthening exercises with abdominal breathing. consider supine IR/ER  Continue manual and  heat. and stab exer. Maybe DN would help lower back and trap spasm    PT Home Exercise Plan  wand flexion; LTR ; single knee to chest with towel     Consulted and Agree with Plan of Care  Patient       Patient will benefit from skilled therapeutic intervention in order to improve the following deficits and impairments:  Abnormal gait, Pain, Decreased safety awareness, Decreased knowledge of precautions, Decreased endurance, Decreased range of motion, Increased muscle spasms, Postural dysfunction, Decreased mobility  Visit Diagnosis: Chronic left-sided low back pain with left-sided sciatica  Cervicalgia  Acute pain of left shoulder  Difficulty in walking, not elsewhere classified     Problem List There are no active problems to display for this patient.   Dessie Comaavid J Verdia Bolt PT DPT  02/22/2017, 9:11 AM  [facility] [address] [place], [place], [address] Phone: [phone]   Fax:  [phone]  Name: Deborah HaloDiane M Ragen MRN: [id number] Date of Birth: [birth year]

## 2017-02-28 ENCOUNTER — Ambulatory Visit: Payer: Medicare Other | Admitting: Physical Therapy

## 2017-02-28 DIAGNOSIS — M542 Cervicalgia: Secondary | ICD-10-CM

## 2017-02-28 DIAGNOSIS — M5442 Lumbago with sciatica, left side: Secondary | ICD-10-CM | POA: Diagnosis not present

## 2017-02-28 DIAGNOSIS — M545 Low back pain, unspecified: Secondary | ICD-10-CM

## 2017-02-28 DIAGNOSIS — M25512 Pain in left shoulder: Secondary | ICD-10-CM

## 2017-02-28 DIAGNOSIS — R262 Difficulty in walking, not elsewhere classified: Secondary | ICD-10-CM

## 2017-02-28 DIAGNOSIS — G8929 Other chronic pain: Secondary | ICD-10-CM

## 2017-02-28 DIAGNOSIS — M79605 Pain in left leg: Secondary | ICD-10-CM

## 2017-02-28 NOTE — Therapy (Signed)
[facility] [address] [place], [place], [address] Phone: [phone]   Fax:  [phone]  Physical Therapy Treatment  Patient Details  Name: Deborah Stein MRN: [id number] Date of Birth: [birth year] Referring Provider: Dr Danae Chen    Encounter Date: 02/28/2017  PT End of Session - 02/28/17 0957    Visit Number  6    Number of Visits  16    Date for PT Re-Evaluation  03/29/17    Authorization Time Period  [employer] medicare     PT Start Time  [phone]    PT Stop Time  1030    PT Time Calculation (min)  57 min    Activity Tolerance  Patient tolerated treatment well    Behavior During Therapy  [facility] for tasks assessed/performed       Past Medical History:  Diagnosis Date  . Environmental allergies   . Hypercholesteremia   . Hypertension     Past Surgical History:  Procedure Laterality Date  . ABDOMINAL HYSTERECTOMY    . TONSILLECTOMY      There were no vitals filed for this visit.  Subjective Assessment - 02/28/17 0943    Subjective  The pain was not too bad over the holiday. She had some pain on Saturday night but was able to do her exercises to improve her pain. She is feeling better this morning.     Limitations  Standing;Lifting    How long can you sit comfortably?  about 20 min before she starts feeling it     How long can you stand comfortably?  15-20 minutes before the pain gets too bad     How long can you walk comfortably?  can walk but does not fewel like she can walk too much     Patient Stated Goals  difficulty sitting for long periods of time     Currently in Pain?  Yes    Pain Score  4     Pain Location  Back    Pain Orientation  Left    Pain Descriptors / Indicators  Aching    Pain Type  Chronic pain    Pain Radiating Towards  going down left leg     Pain Onset  More than a month ago    Pain Frequency  Constant    Aggravating Factors   sitting for too lon     Pain Relieving Factors  rest     Pain  Score  3    Pain Location  Knee    Pain Orientation  Left    Pain Descriptors / Indicators  Aching    Pain Type  Chronic pain    Pain Onset  More than a month ago    Pain Frequency  Constant    Aggravating Factors   use of the arm     Pain Relieving Factors  rest     Effect of Pain on Daily Activities  difficulty perfroming daily activity                       [facility] Adult PT Treatment/Exercise - 02/28/17 0001      Lumbar Exercises: Stretches   Single Knee to Chest Stretch  2 reps;20 seconds;Limitations    Single Knee to Chest Stretch Limitations  No pain     Lower Trunk Rotation Limitations  x10     Piriformis Stretch  2 reps;20 seconds      Lumbar Exercises: Supine  Other Supine Lumbar Exercises  ball squeeze x10 with abdominal breathing; Clamshell with abdominal breathing 2x10 green with TA      Other Supine Lumbar Exercises  posterior pelvic tilt x10 5 sec hold       Shoulder Exercises: Supine   Other Supine Exercises  wand flexion x15      Moist Heat Therapy   Number Minutes Moist Heat  10 Minutes    Moist Heat Location  Lumbar Spine      Manual Therapy   Manual Traction  manual sub occipital release and gentle traction; LAD of left lower extremity;   STW to LT lower spine paraspinals. and manual stretch Posterior hip mob. Improved pain free hip flexion. roll out of the hip and glut in sidelying wih pillow             PT Education - 02/28/17 0954    Education provided  Yes    Education Details  reviewed technique with stretches     Person(s) Educated  Patient    Methods  Explanation;Demonstration;Tactile cues;Verbal cues    Comprehension  Verbalized understanding;Returned demonstration;Verbal cues required;Tactile cues required       PT Short Term Goals - 02/28/17 1002      PT SHORT TERM GOAL #1   Title  Pt will be independent with inital HEP    Baseline  doing HEP 2 x per day    Time  4    Period  Weeks    Status  Achieved      PT SHORT  TERM GOAL #2   Title  Patient will increase left hip strength and left shoulder strength to 4+/5     Baseline  not tested     Time  4    Period  Weeks    Status  On-going      PT SHORT TERM GOAL #3   Title  Patient will report centralized lower back pain without left radiculopathy    Baseline  Pain is in her cerntral lower lumbar spine today. Assess carryover     Time  4    Period  Weeks    Status  On-going      PT SHORT TERM GOAL #4   Title  She will report no LT arm symptoms    Baseline  intermittant    Time  3    Period  Weeks    Status  On-going      PT SHORT TERM GOAL #5   Title  She will be able to   sit and stand for 20 min or more without increased pain    Baseline  45 min sitting , stand at least 20 min    Time  3    Period  Weeks    Status  On-going        PT Long Term Goals - 02/01/17 1740      PT LONG TERM GOAL #1   Title  Patient will stand for 45 minutes without self report of pain in order to perfrom ADL's     Time  8    Period  Weeks    Status  New      PT LONG TERM GOAL #2   Title  Patient will demostrate a 41% limitation on FOTO     Time  8    Period  Weeks    Status  New      PT LONG TERM GOAL #3   Title  She  will report no back pain with home activties and will able to clean bathroom without pain more than 1-2/10    Time  8    Period  Weeks    Status  New      PT LONG TERM GOAL #4   Title  Patient will increase cervical rotation by 15 degrees in order to improve community safety     Time  8    Period  Weeks    Status  New            Plan - 02/28/17 1000    Clinical Impression Statement  Patient passivly able to flex her  hip much better today. Therapy continued to work on manual therapy to improve hip movements.     Clinical Presentation  Evolving    Clinical Decision Making  Moderate    Rehab Potential  Good    PT Frequency  2x / week    PT Duration  8 weeks    PT Treatment/Interventions  ADLs/Self Care Home  Management;Cryotherapy;Electrical Stimulation;Therapeutic exercise;Therapeutic activities;Neuromuscular re-education;Patient/family education;Passive range of motion;Manual techniques;Taping;Dry needling;Moist Heat    PT Next Visit Plan  Continue to focus on whatever is her biggest proglrm. Consider scpaular strengthening exercises with abdominal breathing. consider supine IR/ER  Continue manual and heat. and stab exer.    PT Home Exercise Plan  wand flexion; LTR ; single knee to chest with towel     Consulted and Agree with Plan of Care  Patient       Patient will benefit from skilled therapeutic intervention in order to improve the following deficits and impairments:  Abnormal gait, Pain, Decreased safety awareness, Decreased knowledge of precautions, Decreased endurance, Decreased range of motion, Increased muscle spasms, Postural dysfunction, Decreased mobility  Visit Diagnosis: Chronic left-sided low back pain with left-sided sciatica  Cervicalgia  Acute pain of left shoulder  Difficulty in walking, not elsewhere classified  Acute bilateral low back pain without sciatica  Pain in left leg     Problem List There are no active problems to display for this patient.   Dessie ComaDavid J Doyle Tegethoff 02/28/2017, 2:37 PM  [facility] [address] [place], [place], [address] Phone: [phone]   Fax:  [phone]  Name: Russ HaloDiane M Rutkowski MRN: [id number] Date of Birth: [birth year]

## 2017-03-02 ENCOUNTER — Encounter: Payer: Self-pay | Admitting: Physical Therapy

## 2017-03-02 ENCOUNTER — Ambulatory Visit: Payer: Medicare Other | Admitting: Physical Therapy

## 2017-03-02 DIAGNOSIS — G8929 Other chronic pain: Secondary | ICD-10-CM

## 2017-03-02 DIAGNOSIS — M542 Cervicalgia: Secondary | ICD-10-CM

## 2017-03-02 DIAGNOSIS — M5442 Lumbago with sciatica, left side: Secondary | ICD-10-CM | POA: Diagnosis not present

## 2017-03-02 DIAGNOSIS — M25512 Pain in left shoulder: Secondary | ICD-10-CM

## 2017-03-02 NOTE — Therapy (Signed)
[facility] [address] [place], [place], [address] Phone: [phone]   Fax:  [phone]  Physical Therapy Treatment  Patient Details  Name: Deborah Stein MRN: [id number] Date of Birth: Nov 06, 1940 Referring Provider: Dr Danae ChenJohn Griffen    Encounter Date: 03/02/2017  PT End of Session - 03/02/17 0938    Visit Number  7    Number of Visits  16    Date for PT Re-Evaluation  03/29/17    Authorization Time Period  [employer] medicare     PT Start Time  0930    PT Stop Time  1023    PT Time Calculation (min)  53 min    Activity Tolerance  Patient tolerated treatment well    Behavior During Therapy  [facility]/performed       Past Medical History:  Diagnosis Date  . Environmental allergies   . Hypercholesteremia   . Hypertension     Past Surgical History:  Procedure Laterality Date  . ABDOMINAL HYSTERECTOMY    . TONSILLECTOMY      There were no vitals filed for this visit.  Subjective Assessment - 03/02/17 0935    Subjective  Patient was a little sore after the last visit. Today she is a little sore in her neck and back. Has some apin with standing this morning.     Limitations  Standing;Lifting    How long can you sit comfortably?  about 20 min before she starts feeling it     How long can you stand comfortably?  15-20 minutes before the pain gets too bad     How long can you walk comfortably?  can walk but does not fewel like she can walk too much     Patient Stated Goals  difficulty sitting for long periods of time     Currently in Pain?  Yes    Pain Score  4     Pain Location  Back    Pain Orientation  Left    Pain Descriptors / Indicators  Aching    Pain Type  Chronic pain    Pain Radiating Towards  going down the left leg     Pain Onset  More than a month ago    Pain Frequency  Constant    Aggravating Factors   standing     Pain Relieving Factors  rest     Multiple Pain Sites  Yes    Pain Score  4     Pain Location  Neck    Pain Orientation  Left    Pain Descriptors / Indicators  Aching    Pain Type  Chronic pain    Pain Onset  More than a month ago    Pain Frequency  Constant    Aggravating Factors   use of the arm     Pain Relieving Factors  rest     Effect of Pain on Daily Activities  difficulty perfroming dialy activity                       [facility] Adult PT Treatment/Exercise - 03/02/17 0001      Lumbar Exercises: Stretches   Passive Hamstring Stretch Limitations  bilateral strap 2x20 sec hold     Single Knee to Chest Stretch  2 reps;20 seconds;Limitations    Single Knee to Chest Stretch Limitations  No pain     Lower Trunk Rotation Limitations  x10  Piriformis Stretch  2 reps;20 seconds      Lumbar Exercises: Supine   Other Supine Lumbar Exercises  ball squeeze x10 with abdominal breathing; Clamshell with abdominal breathing 2x10 green with TA      Other Supine Lumbar Exercises  posterior pelvic tilt x10 5 sec hold       Shoulder Exercises: Supine   Other Supine Exercises  wand flexion x15    Other Supine Exercises  supine horizontal abduction yellwo in low range; supine shoulder ER yellow x10 in low range.       Moist Heat Therapy   Number Minutes Moist Heat  10 Minutes    Moist Heat Location  Lumbar Spine      Manual Therapy   Manual Therapy  Soft tissue mobilization    Soft tissue mobilization  trigger pint relase to L1-L2 left lumbaer area.     Manual Traction  manual sub occipital release and gentle traction; LAD of left lower extremity;   STW to LT lower spine paraspinals. and manual stretch Posterior hip mob. Improved pain free hip flexion. roll out of the hip and glut in sidelying wih pillow             PT Education - 03/02/17 0937    Education provided  Yes    Education Details  reviewed technique with stretches     Person(s) Educated  Patient    Methods  Explanation;Demonstration;Tactile cues;Verbal cues    Comprehension   Verbalized understanding;Returned demonstration;Verbal cues required;Tactile cues required       PT Short Term Goals - 03/02/17 1034      PT SHORT TERM GOAL #1   Title  Pt will be independent with inital HEP    Baseline  doing HEP 2 x per day    Time  4    Period  Weeks    Status  Achieved      PT SHORT TERM GOAL #2   Title  Patient will increase left hip strength and left shoulder strength to 4+/5     Baseline  not tested     Time  4    Period  Weeks    Status  On-going      PT SHORT TERM GOAL #3   Title  Patient will report centralized lower back pain without left radiculopathy    Baseline  Pain is in her cerntral lower lumbar spine today. Assess carryover     Time  4    Period  Weeks    Status  On-going      PT SHORT TERM GOAL #4   Title  She will report no LT arm symptoms    Baseline  intermittant    Time  3    Period  Weeks    Status  On-going      PT SHORT TERM GOAL #5   Title  She will be able to   sit and stand for 20 min or more without increased pain    Baseline  45 min sitting , stand at least 20 min    Time  3    Period  Weeks    Status  On-going        PT Long Term Goals - 02/01/17 1740      PT LONG TERM GOAL #1   Title  Patient will stand for 45 minutes without self report of pain in order to perfrom ADL's     Time  8    Period  Weeks    Status  New      PT LONG TERM GOAL #2   Title  Patient will demostrate a 41% limitation on FOTO     Time  8    Period  Weeks    Status  New      PT LONG TERM GOAL #3   Title  She will report no back pain with home activties and will able to clean bathroom without pain more than 1-2/10    Time  8    Period  Weeks    Status  New      PT LONG TERM GOAL #4   Title  Patient will increase cervical rotation by 15 degrees in order to improve community safety     Time  8    Period  Weeks    Status  New            Plan - 03/02/17 1030    Clinical Impression Statement  Patient continues to haveing  spasming of her left upper trap. Her pain levels are about the same. She had increased pain with wa d flexion to 90 degrees. She was encouraged to work on this at home.     Clinical Presentation  Evolving    Clinical Decision Making  Moderate    Rehab Potential  Good    PT Frequency  2x / week    PT Duration  8 weeks    PT Treatment/Interventions  ADLs/Self Care Home Management;Cryotherapy;Electrical Stimulation;Therapeutic exercise;Therapeutic activities;Neuromuscular re-education;Patient/family education;Passive range of motion;Manual techniques;Taping;Dry needling;Moist Heat    PT Next Visit Plan  Continue to focus on whatever is her biggest proglrm. Consider scpaular strengthening exercises with abdominal breathing. consider supine IR/ER  Continue manual and heat. and stab exer.    PT Home Exercise Plan  wand flexion; LTR ; single knee to chest with towel     Consulted and Agree with Plan of Care  Patient       Patient will benefit from skilled therapeutic intervention in order to improve the following deficits and impairments:  Abnormal gait, Pain, Decreased safety awareness, Decreased knowledge of precautions, Decreased endurance, Decreased range of motion, Increased muscle spasms, Postural dysfunction, Decreased mobility  Visit Diagnosis: Chronic left-sided low back pain with left-sided sciatica  Cervicalgia  Acute pain of left shoulder     Problem List There are no active problems to display for this patient.   Dessie ComaDavid J Ada Woodbury 03/02/2017, 10:49 AM  [facility] [address] [place], [place], [address] Phone: [phone]   Fax:  [phone]  Name: Deborah Stein MRN: [id number] Date of Birth: 06-01-1940

## 2017-03-08 ENCOUNTER — Encounter: Payer: Self-pay | Admitting: Physical Therapy

## 2017-03-08 ENCOUNTER — Ambulatory Visit: Payer: Medicare Other | Attending: Internal Medicine | Admitting: Physical Therapy

## 2017-03-08 DIAGNOSIS — M25512 Pain in left shoulder: Secondary | ICD-10-CM | POA: Diagnosis present

## 2017-03-08 DIAGNOSIS — M545 Low back pain: Secondary | ICD-10-CM | POA: Diagnosis present

## 2017-03-08 DIAGNOSIS — R262 Difficulty in walking, not elsewhere classified: Secondary | ICD-10-CM | POA: Diagnosis present

## 2017-03-08 DIAGNOSIS — M542 Cervicalgia: Secondary | ICD-10-CM | POA: Diagnosis present

## 2017-03-08 DIAGNOSIS — G8929 Other chronic pain: Secondary | ICD-10-CM | POA: Diagnosis present

## 2017-03-08 DIAGNOSIS — M5442 Lumbago with sciatica, left side: Secondary | ICD-10-CM | POA: Diagnosis not present

## 2017-03-08 DIAGNOSIS — M79605 Pain in left leg: Secondary | ICD-10-CM | POA: Insufficient documentation

## 2017-03-09 ENCOUNTER — Encounter: Payer: Self-pay | Admitting: Physical Therapy

## 2017-03-09 NOTE — Therapy (Signed)
[facility] [address] [place], [place], [address] Phone: [phone]   Fax:  [phone]  Physical Therapy Treatment  Patient Details  Name: Deborah Stein MRN: [id number] Date of Birth: 1940/04/27 Referring Provider: Dr Danae ChenJohn Griffen    Encounter Date: 03/08/2017  PT End of Session - 03/08/17 1150    Visit Number  8    Number of Visits  16    Date for PT Re-Evaluation  03/29/17    Authorization Time Period  [employer] medicare     PT Start Time  1143    PT Stop Time  1235    PT Time Calculation (min)  52 min    Activity Tolerance  Patient tolerated treatment well    Behavior During Therapy  [facility] for [facility] assessed/performed       Past Medical History:  Diagnosis Date  . Environmental allergies   . Hypercholesteremia   . Hypertension     Past Surgical History:  Procedure Laterality Date  . ABDOMINAL HYSTERECTOMY    . TONSILLECTOMY      There were no vitals filed for this visit.  Subjective Assessment - 03/08/17 1146    Subjective  Patient reports she is a little sore this morning. She did a lot of activity yesterday. She has more pain in her back.     Limitations  Standing;Lifting    How long can you sit comfortably?  about 20 min before she starts feeling it     How long can you stand comfortably?  15-20 minutes before the pain gets too bad     How long can you walk comfortably?  can walk but does not fewel like she can walk too much     Patient Stated Goals  difficulty sitting for long periods of time     Currently in Pain?  Yes    Pain Score  4     Pain Location  Back    Pain Orientation  Left    Pain Descriptors / Indicators  Aching    Pain Type  Chronic pain    Pain Radiating Towards  going down the left leg     Pain Onset  More than a month ago    Pain Frequency  Constant    Aggravating Factors   standing     Pain Relieving Factors  rest     Effect of Pain on Daily Activities  difficulty perfroming ADL's      Multiple Pain Sites  Yes    Pain Score  3    Pain Location  Neck    Pain Orientation  Left    Pain Descriptors / Indicators  Aching    Pain Type  Chronic pain    Pain Onset  More than a month ago    Pain Frequency  Constant    Aggravating Factors   use of the arm     Pain Relieving Factors  rest     Effect of Pain on Daily Activities  difficulty perfroming ADl's                       [facility] Adult PT Treatment/Exercise - 03/09/17 0001      Lumbar Exercises: Stretches   Passive Hamstring Stretch Limitations  bilateral strap 2x20 sec hold     Single Knee to Chest Stretch  2 reps;20 seconds;Limitations    Single Knee to Chest Stretch Limitations  No pain     Lower  Trunk Rotation Limitations  x10     Piriformis Stretch  2 reps;20 seconds      Lumbar Exercises: Standing   Other Standing Lumbar Exercises  shoulder extension red 2x10; scap retraction 2x10 both with cuing for abdominal brace       Lumbar Exercises: Supine   Other Supine Lumbar Exercises  ball squeeze x10 with abdominal breathing; Clamshell with abdominal breathing 2x10 green with TA      Other Supine Lumbar Exercises  posterior pelvic tilt x10 5 sec hold       Shoulder Exercises: Supine   Other Supine Exercises  wand flexion x15    Other Supine Exercises  supine horizontal abduction yellwo in low range; supine shoulder ER yellow x10 in low range.       Moist Heat Therapy   Moist Heat Location  Lumbar Spine      Manual Therapy   Manual Therapy  Soft tissue mobilization    Soft tissue mobilization  trigger pint relase to L1-L2 left lumbaer area.     Manual Traction  manual sub occipital release and gentle traction; LAD of left lower extremity;   STW to LT lower spine paraspinals. and manual stretch Posterior hip mob. Improved pain free hip flexion. roll out of the hip and glut in sidelying wih pillow             PT Education - 03/08/17 1149    Education provided  Yes    Education Details   reviewed tehcnique with ther-ex     Person(s) Educated  Patient    Methods  Explanation;Demonstration;Tactile cues;Verbal cues    Comprehension  Verbalized understanding;Returned demonstration;Verbal cues required;Tactile cues required       PT Short Term Goals - 03/02/17 1034      PT SHORT TERM GOAL #1   Title  Pt will be independent with inital HEP    Baseline  doing HEP 2 x per day    Time  4    Period  Weeks    Status  Achieved      PT SHORT TERM GOAL #2   Title  Patient will increase left hip strength and left shoulder strength to 4+/5     Baseline  not tested     Time  4    Period  Weeks    Status  On-going      PT SHORT TERM GOAL #3   Title  Patient will report centralized lower back pain without left radiculopathy    Baseline  Pain is in her cerntral lower lumbar spine today. Assess carryover     Time  4    Period  Weeks    Status  On-going      PT SHORT TERM GOAL #4   Title  She will report no LT arm symptoms    Baseline  intermittant    Time  3    Period  Weeks    Status  On-going      PT SHORT TERM GOAL #5   Title  She will be able to   sit and stand for 20 min or more without increased pain    Baseline  45 min sitting , stand at least 20 min    Time  3    Period  Weeks    Status  On-going        PT Long Term Goals - 02/01/17 1740      PT LONG TERM GOAL #1   Title  Patient will stand for 45 minutes without self report of pain in order to perfrom ADL's     Time  8    Period  Weeks    Status  New      PT LONG TERM GOAL #2   Title  Patient will demostrate a 41% limitation on FOTO     Time  8    Period  Weeks    Status  New      PT LONG TERM GOAL #3   Title  She will report no back pain with home activties and will able to clean bathroom without pain more than 1-2/10    Time  8    Period  Weeks    Status  New      PT LONG TERM GOAL #4   Title  Patient will increase cervical rotation by 15 degrees in order to improve community safety     Time   8    Period  Weeks    Status  New            Plan - 03/08/17 1203    Clinical Impression Statement  Patient tolerated exercises and stretching well . She was encouraged to continue working on her exercises at home.     Clinical Presentation  Evolving    Clinical Decision Making  Moderate    Rehab Potential  Good    PT Frequency  2x / week    PT Duration  8 weeks    PT Treatment/Interventions  ADLs/Self Care Home Management;Cryotherapy;Electrical Stimulation;Therapeutic exercise;Therapeutic activities;Neuromuscular re-education;Patient/family education;Passive range of motion;Manual techniques;Taping;Dry needling;Moist Heat    PT Next Visit Plan  Continue to focus on whatever is her biggest proglrm. Consider scpaular strengthening exercises with abdominal breathing. Perfrom re-eval at visit 10     PT Home Exercise Plan  wand flexion; LTR ; single knee to chest with towel     Consulted and Agree with Plan of Care  Patient       Patient will benefit from skilled therapeutic intervention in order to improve the following deficits and impairments:  Abnormal gait, Pain, Decreased safety awareness, Decreased knowledge of precautions, Decreased endurance, Decreased range of motion, Increased muscle spasms, Postural dysfunction, Decreased mobility  Visit Diagnosis: Chronic left-sided low back pain with left-sided sciatica  Acute pain of left shoulder  Cervicalgia     Problem List There are no active problems to display for this patient.   Dessie Coma PT DPT  03/09/2017, 8:13 AM  [facility] [address] [place], [place], [address] Phone: [phone]   Fax:  [phone]  Name: Deborah Stein MRN: [id number] Date of Birth: 07-08-1940

## 2017-03-12 ENCOUNTER — Ambulatory Visit: Payer: Medicare Other | Admitting: Physical Therapy

## 2017-03-12 DIAGNOSIS — M545 Low back pain, unspecified: Secondary | ICD-10-CM

## 2017-03-12 DIAGNOSIS — M5442 Lumbago with sciatica, left side: Secondary | ICD-10-CM | POA: Diagnosis not present

## 2017-03-12 DIAGNOSIS — M542 Cervicalgia: Secondary | ICD-10-CM

## 2017-03-12 DIAGNOSIS — M25512 Pain in left shoulder: Secondary | ICD-10-CM

## 2017-03-12 DIAGNOSIS — G8929 Other chronic pain: Secondary | ICD-10-CM

## 2017-03-12 DIAGNOSIS — R262 Difficulty in walking, not elsewhere classified: Secondary | ICD-10-CM

## 2017-03-12 NOTE — Therapy (Signed)
[facility] [address] [place], [place], [address] Phone: [phone]   Fax:  [phone]  Physical Therapy Treatment  Patient Details  Name: Deborah Stein MRN: [id number] Date of Birth: Sep 23, 1940 Referring Provider: Dr Danae Chen    Encounter Date: 03/12/2017  PT End of Session - 03/12/17 0859    Visit Number  9    Number of Visits  16    Date for PT Re-Evaluation  03/29/17    Authorization Time Period  [facility] medicare     PT Start Time  [phone]    PT Stop Time  0940    PT Time Calculation (min)  53 min    Activity Tolerance  Patient tolerated treatment well    Behavior During Therapy  [facility] for tasks assessed/performed       Past Medical History:  Diagnosis Date  . Environmental allergies   . Hypercholesteremia   . Hypertension     Past Surgical History:  Procedure Laterality Date  . ABDOMINAL HYSTERECTOMY    . TONSILLECTOMY      There were no vitals filed for this visit.  Subjective Assessment - 03/12/17 0855    Subjective  Patient is having some stiffness in her right shouldr this morning. Overall she feels like she is improving. She got a little sore after the last visit but not too bad.     Limitations  Standing;Lifting    How long can you sit comfortably?  about 20 min before she starts feeling it     How long can you stand comfortably?  15-20 minutes before the pain gets too bad     How long can you walk comfortably?  can walk but does not fewel like she can walk too much     Patient Stated Goals  difficulty sitting for long periods of time     Currently in Pain?  Yes    Pain Score  3     Pain Location  Back    Pain Orientation  Left    Pain Descriptors / Indicators  Aching    Pain Type  Chronic pain    Pain Radiating Towards  going down the left leg     Pain Onset  More than a month ago    Pain Frequency  Constant    Aggravating Factors   standing     Pain Relieving Factors  rest     Effect of Pain  on Daily Activities  difficultyy performing ADL;s     Pain Score  3    Pain Location  Neck    Pain Orientation  Left    Pain Descriptors / Indicators  Aching    Pain Onset  More than a month ago    Pain Frequency  Constant    Aggravating Factors   use of the arm     Pain Relieving Factors  rest     Effect of Pain on Daily Activities  difficulty perfroming ADl's                       [facility] Adult PT Treatment/Exercise - 03/12/17 0001      Lumbar Exercises: Stretches   Passive Hamstring Stretch Limitations  bilateral strap 2x20 sec hold     Single Knee to Chest Stretch  2 reps;20 seconds;Limitations    Single Knee to Chest Stretch Limitations  No pain     Lower Trunk Rotation Limitations  x10  Piriformis Stretch  2 reps;20 seconds      Lumbar Exercises: Standing   Other Standing Lumbar Exercises  shoulder extension red 2x10; scap retraction 2x10 both with cuing for abdominal brace       Lumbar Exercises: Supine   Other Supine Lumbar Exercises  ball squeeze x10 with abdominal breathing; Clamshell with abdominal breathing 2x10 green with TA      Other Supine Lumbar Exercises  posterior pelvic tilt x10 5 sec hold       Shoulder Exercises: Supine   Other Supine Exercises  wand flexion x15    Other Supine Exercises  supine horizontal abduction yellwo in low range; supine shoulder ER yellow x10 in low range.       Moist Heat Therapy   Moist Heat Location  Lumbar Spine      Manual Therapy   Manual Therapy  Soft tissue mobilization;Passive ROM    Soft tissue mobilization  trigger p0int relase to L1-L2 left lumbaer area.     Passive ROM  passive right shoulder flexion and ER     Manual Traction  manual sub occipital release and gentle traction; LAD of left lower extremity;   STW to LT lower spine paraspinals. and manual stretch Posterior hip mob. Improved pain free hip flexion. roll out of the hip and glut in sidelying wih pillow             PT Education -  03/12/17 0857    Education provided  Yes    Education Details  updated HEP     Person(s) Educated  Patient    Methods  Explanation;Demonstration;Tactile cues;Verbal cues    Comprehension  Verbalized understanding;Returned demonstration;Verbal cues required;Tactile cues required       PT Short Term Goals - 03/02/17 1034      PT SHORT TERM GOAL #1   Title  Pt will be independent with inital HEP    Baseline  doing HEP 2 x per day    Time  4    Period  Weeks    Status  Achieved      PT SHORT TERM GOAL #2   Title  Patient will increase left hip strength and left shoulder strength to 4+/5     Baseline  not tested     Time  4    Period  Weeks    Status  On-going      PT SHORT TERM GOAL #3   Title  Patient will report centralized lower back pain without left radiculopathy    Baseline  Pain is in her cerntral lower lumbar spine today. Assess carryover     Time  4    Period  Weeks    Status  On-going      PT SHORT TERM GOAL #4   Title  She will report no LT arm symptoms    Baseline  intermittant    Time  3    Period  Weeks    Status  On-going      PT SHORT TERM GOAL #5   Title  She will be able to   sit and stand for 20 min or more without increased pain    Baseline  45 min sitting , stand at least 20 min    Time  3    Period  Weeks    Status  On-going        PT Long Term Goals - 02/01/17 1740      PT LONG TERM GOAL #1  Title  Patient will stand for 45 minutes without self report of pain in order to perfrom ADL's     Time  8    Period  Weeks    Status  New      PT LONG TERM GOAL #2   Title  Patient will demostrate a 41% limitation on FOTO     Time  8    Period  Weeks    Status  New      PT LONG TERM GOAL #3   Title  She will report no back pain with home activties and will able to clean bathroom without pain more than 1-2/10    Time  8    Period  Weeks    Status  New      PT LONG TERM GOAL #4   Title  Patient will increase cervical rotation by 15 degrees  in order to improve community safety     Time  8    Period  Weeks    Status  New            Plan - 03/12/17 1610    Clinical Impression Statement  The patient had less stiffness in her shoulder after treatment. She was given a band and therapy reviewed how to put it in the door. She was advised that if she is not doing the exercises right then she should not do it and to come back to therapy. Her spasming appears to be decreasing.     Clinical Presentation  Evolving    Clinical Decision Making  Moderate    Rehab Potential  Good    PT Frequency  2x / week    PT Duration  8 weeks    PT Treatment/Interventions  ADLs/Self Care Home Management;Cryotherapy;Electrical Stimulation;Therapeutic exercise;Therapeutic activities;Neuromuscular re-education;Patient/family education;Passive range of motion;Manual techniques;Taping;Dry needling;Moist Heat    PT Next Visit Plan  Continue to focus on whatever is her biggest proglrm. Consider scpaular strengthening exercises with abdominal breathing. Perfrom re-eval at visit 10     PT Home Exercise Plan  wand flexion; LTR ; single knee to chest with towel     Consulted and Agree with Plan of Care  Patient       Patient will benefit from skilled therapeutic intervention in order to improve the following deficits and impairments:  Abnormal gait, Pain, Decreased safety awareness, Decreased knowledge of precautions, Decreased endurance, Decreased range of motion, Increased muscle spasms, Postural dysfunction, Decreased mobility  Visit Diagnosis: Chronic left-sided low back pain with left-sided sciatica  Acute pain of left shoulder  Cervicalgia  Difficulty in walking, not elsewhere classified  Acute bilateral low back pain without sciatica     Problem List There are no active problems to display for this patient.   Dessie Coma PT DPT  03/12/2017, 10:12 AM  [facility] [address] [place], [place], [address] Phone: [phone]   Fax:  [phone]  Name: Deborah Stein MRN: [id number] Date of Birth: [birth year]

## 2017-03-14 ENCOUNTER — Ambulatory Visit: Payer: Medicare Other | Admitting: Physical Therapy

## 2017-03-19 ENCOUNTER — Ambulatory Visit: Payer: Medicare Other | Admitting: Physical Therapy

## 2017-03-19 DIAGNOSIS — M5442 Lumbago with sciatica, left side: Principal | ICD-10-CM

## 2017-03-19 DIAGNOSIS — M542 Cervicalgia: Secondary | ICD-10-CM

## 2017-03-19 DIAGNOSIS — R262 Difficulty in walking, not elsewhere classified: Secondary | ICD-10-CM

## 2017-03-19 DIAGNOSIS — M25512 Pain in left shoulder: Secondary | ICD-10-CM

## 2017-03-19 DIAGNOSIS — G8929 Other chronic pain: Secondary | ICD-10-CM

## 2017-03-19 NOTE — Therapy (Signed)
[facility] [address] [place], [place], [address] Phone: [phone]   Fax:  [phone]  Physical Therapy Treatment  Patient Details  Name: Deborah HaloDiane M Magan MRN: [id number] Date of Birth: 06-12-1940 Referring Provider: Dr Danae ChenJohn Griffen    Encounter Date: 03/19/2017  PT End of Session - 03/19/17 0850    Visit Number  10    Number of Visits  16    Date for PT Re-Evaluation  03/29/17    Authorization Time Period  [employer] medicare     PT Start Time  [phone]    PT Stop Time  0930    PT Time Calculation (min)  44 min    Activity Tolerance  Patient tolerated treatment well    Behavior During Therapy  [facility] for tasks assessed/performed       Past Medical History:  Diagnosis Date  . Environmental allergies   . Hypercholesteremia   . Hypertension     Past Surgical History:  Procedure Laterality Date  . ABDOMINAL HYSTERECTOMY    . TONSILLECTOMY      There were no vitals filed for this visit.  Subjective Assessment - 03/19/17 0850    Subjective  Patient reports she had a rought day yesterday. She had pain down into her cald. She feels like her neck is about a 3/10     Limitations  Standing;Lifting    How long can you sit comfortably?  about 20 min before she starts feeling it     How long can you stand comfortably?  15-20 minutes before the pain gets too bad     How long can you walk comfortably?  can walk but does not fewel like she can walk too much     Patient Stated Goals  difficulty sitting for long periods of time     Currently in Pain?  Yes    Pain Score  3     Pain Location  Back    Pain Orientation  Left    Pain Descriptors / Indicators  Aching    Pain Type  Chronic pain    Pain Radiating Towards  going down the left leg     Pain Onset  More than a month ago    Pain Frequency  Constant    Aggravating Factors   standing     Pain Relieving Factors  rest     Effect of Pain on Daily Activities  difficulty perfroming  ADL's     Multiple Pain Sites  Yes    Pain Score  3    Pain Location  Neck    Pain Orientation  Left    Pain Descriptors / Indicators  Aching    Pain Type  Chronic pain    Pain Onset  More than a month ago    Pain Frequency  Constant    Aggravating Factors   use of the arm     Pain Relieving Factors  rest    Effect of Pain on Daily Activities  difficulty perfroming ADL's                       [facility] Adult PT Treatment/Exercise - 03/19/17 0001      Lumbar Exercises: Stretches   Passive Hamstring Stretch Limitations  bilateral strap 2x20 sec hold     Single Knee to Chest Stretch  2 reps;20 seconds;Limitations    Single Knee to Chest Stretch Limitations  No pain     Lower  Trunk Rotation Limitations  x10     Piriformis Stretch  2 reps;20 seconds      Lumbar Exercises: Standing   Other Standing Lumbar Exercises  shoulder extension red 2x10; scap retraction 2x10 both with cuing for abdominal brace       Lumbar Exercises: Supine   Other Supine Lumbar Exercises  ball squeeze x10 with abdominal breathing; Clamshell with abdominal breathing 2x10 green with TA      Other Supine Lumbar Exercises  posterior pelvic tilt x10 5 sec hold       Shoulder Exercises: Supine   Other Supine Exercises  wand flexion x15    Other Supine Exercises  supine horizontal abduction yellwo in low range; supine shoulder ER yellow x10 in low range.       Moist Heat Therapy   Moist Heat Location  Lumbar Spine      Manual Therapy   Manual Therapy  Soft tissue mobilization;Passive ROM    Soft tissue mobilization  trigger p0int relase to L1-L2 left lumbaer area.              PT Education - 03/19/17 [phone]    Education provided  Yes    Education Details  reviewed strething and ther-ex technique     Person(s) Educated  Patient    Methods  Explanation;Demonstration;Tactile cues;Verbal cues    Comprehension  Verbalized understanding;Returned demonstration;Verbal cues required;Tactile cues  required       PT Short Term Goals - 03/02/17 1034      PT SHORT TERM GOAL #1   Title  Pt will be independent with inital HEP    Baseline  doing HEP 2 x per day    Time  4    Period  Weeks    Status  Achieved      PT SHORT TERM GOAL #2   Title  Patient will increase left hip strength and left shoulder strength to 4+/5     Baseline  not tested     Time  4    Period  Weeks    Status  On-going      PT SHORT TERM GOAL #3   Title  Patient will report centralized lower back pain without left radiculopathy    Baseline  Pain is in her cerntral lower lumbar spine today. Assess carryover     Time  4    Period  Weeks    Status  On-going      PT SHORT TERM GOAL #4   Title  She will report no LT arm symptoms    Baseline  intermittant    Time  3    Period  Weeks    Status  On-going      PT SHORT TERM GOAL #5   Title  She will be able to   sit and stand for 20 min or more without increased pain    Baseline  45 min sitting , stand at least 20 min    Time  3    Period  Weeks    Status  On-going        PT Long Term Goals - 02/01/17 1740      PT LONG TERM GOAL #1   Title  Patient will stand for 45 minutes without self report of pain in order to perfrom ADL's     Time  8    Period  Weeks    Status  New      PT LONG TERM GOAL #2  Title  Patient will demostrate a 41% limitation on FOTO     Time  8    Period  Weeks    Status  New      PT LONG TERM GOAL #3   Title  She will report no back pain with home activties and will able to clean bathroom without pain more than 1-2/10    Time  8    Period  Weeks    Status  New      PT LONG TERM GOAL #4   Title  Patient will increase cervical rotation by 15 degrees in order to improve community safety     Time  8    Period  Weeks    Status  New            Plan - 03/19/17 9147    Clinical Impression Statement  Patient tolerated treatment well. She had no increase in pain with treatment. Therapy reviewed standing exercises  again. Patient had spasming in her neck and back.     Clinical Presentation  Evolving    Clinical Decision Making  Moderate    Rehab Potential  Good    PT Frequency  2x / week    PT Duration  8 weeks    PT Treatment/Interventions  ADLs/Self Care Home Management;Cryotherapy;Electrical Stimulation;Therapeutic exercise;Therapeutic activities;Neuromuscular re-education;Patient/family education;Passive range of motion;Manual techniques;Taping;Dry needling;Moist Heat    PT Next Visit Plan  Continue to focus on whatever is her biggest proglrm. Consider scpaular strengthening exercises with abdominal breathing. Perfrom re-eval at visit 10     PT Home Exercise Plan  wand flexion; LTR ; single knee to chest with towel     Consulted and Agree with Plan of Care  Patient       Patient will benefit from skilled therapeutic intervention in order to improve the following deficits and impairments:  Abnormal gait, Pain, Decreased safety awareness, Decreased knowledge of precautions, Decreased endurance, Decreased range of motion, Increased muscle spasms, Postural dysfunction, Decreased mobility  Visit Diagnosis: Chronic left-sided low back pain with left-sided sciatica  Acute pain of left shoulder  Cervicalgia  Difficulty in walking, not elsewhere classified     Problem List There are no active problems to display for this patient.   Dessie Coma PT DPT  03/19/2017, 10:14 AM  [facility] [address] [place], [place], [address] Phone: [phone]   Fax:  [phone]  Name: CARRAH EPPOLITO MRN: [id number] Date of Birth: [birth year]

## 2017-03-21 ENCOUNTER — Encounter: Payer: Self-pay | Admitting: Physical Therapy

## 2017-03-21 ENCOUNTER — Ambulatory Visit: Payer: Medicare Other | Admitting: Physical Therapy

## 2017-03-21 DIAGNOSIS — R262 Difficulty in walking, not elsewhere classified: Secondary | ICD-10-CM

## 2017-03-21 DIAGNOSIS — M25512 Pain in left shoulder: Secondary | ICD-10-CM

## 2017-03-21 DIAGNOSIS — M545 Low back pain, unspecified: Secondary | ICD-10-CM

## 2017-03-21 DIAGNOSIS — M5442 Lumbago with sciatica, left side: Principal | ICD-10-CM

## 2017-03-21 DIAGNOSIS — M79605 Pain in left leg: Secondary | ICD-10-CM

## 2017-03-21 DIAGNOSIS — G8929 Other chronic pain: Secondary | ICD-10-CM

## 2017-03-21 DIAGNOSIS — M542 Cervicalgia: Secondary | ICD-10-CM

## 2017-03-21 NOTE — Therapy (Signed)
[facility] [address] [place], [place], [address] Phone: [phone]   Fax:  [phone]  Physical Therapy Treatment  Patient Details  Name: Deborah Stein MRN: [id number] Date of Birth: Nov 08, 1940 Referring Provider: Dr Danae Chen    Encounter Date: 03/21/2017  PT End of Session - 03/21/17 0854    Visit Number  11    Number of Visits  16    Date for PT Re-Evaluation  03/29/17    Authorization Time Period  [facility] medicare     PT Start Time  0845    PT Stop Time  0926    PT Time Calculation (min)  41 min    Activity Tolerance  Patient tolerated treatment well    Behavior During Therapy  [facility] for tasks assessed/performed       Past Medical History:  Diagnosis Date  . Environmental allergies   . Hypercholesteremia   . Hypertension     Past Surgical History:  Procedure Laterality Date  . ABDOMINAL HYSTERECTOMY    . TONSILLECTOMY      There were no vitals filed for this visit.  Subjective Assessment - 03/21/17 0851    Subjective  Patient reports being a little sore after th last visit but she is bettewr today She is having less pain overall. She has been sleeping a little better.     Limitations  Standing;Lifting    How long can you sit comfortably?  about 20 min before she starts feeling it     How long can you stand comfortably?  15-20 minutes before the pain gets too bad     How long can you walk comfortably?  can walk but does not fewel like she can walk too much     Patient Stated Goals  difficulty sitting for long periods of time     Currently in Pain?  Yes    Pain Score  3     Pain Location  Neck    Pain Orientation  Left    Pain Descriptors / Indicators  Aching    Pain Type  Chronic pain    Pain Onset  More than a month ago    Pain Frequency  Constant    Aggravating Factors   standing     Pain Relieving Factors  rest    Effect of Pain on Daily Activities  difficulty perfroming ADL's     Pain Score  3     Pain Location  Neck    Pain Orientation  Left    Pain Descriptors / Indicators  Aching    Pain Type  Chronic pain    Pain Onset  More than a month ago    Pain Frequency  Constant    Aggravating Factors   use of the arm     Pain Relieving Factors  rest     Effect of Pain on Daily Activities  difficulty perfroming ADl's                       [facility] Adult PT Treatment/Exercise - 03/21/17 0001      Lumbar Exercises: Stretches   Passive Hamstring Stretch Limitations  bilateral strap 2x20 sec hold     Single Knee to Chest Stretch  2 reps;20 seconds;Limitations    Lower Trunk Rotation Limitations  x10     Piriformis Stretch  2 reps;20 seconds      Lumbar Exercises: Supine   Other Supine Lumbar Exercises  ball squeeze x10 with abdominal breathing; Clamshell with abdominal breathing 2x10 green with TA      Other Supine Lumbar Exercises  posterior pelvic tilt x10 5 sec hold       Shoulder Exercises: Supine   Other Supine Exercises  band flexion 2x10; horizontal abduction x10; Bilateral ER 2x10       Moist Heat Therapy   Number Minutes Moist Heat  10 Minutes    Moist Heat Location  Lumbar Spine      Manual Therapy   Manual Therapy  Soft tissue mobilization;Passive ROM    Soft tissue mobilization  trigger point release to bilateral upper traps    Manual Traction  manual sub occipital release and gentle traction;              PT Education - 03/21/17 0853    Education provided  Yes    Education Details  reviewed HEP and strengthening     Person(s) Educated  Patient    Methods  Explanation;Tactile cues;Demonstration;Verbal cues    Comprehension  Verbalized understanding;Returned demonstration;Verbal cues required;Tactile cues required;Need further instruction       PT Short Term Goals - 03/21/17 0911      PT SHORT TERM GOAL #1   Title  Pt will be independent with inital HEP    Baseline  doing HEP 2 x per day    Time  4    Period  Weeks    Status  Achieved       PT SHORT TERM GOAL #2   Title  Patient will increase left hip strength and left shoulder strength to 4+/5     Baseline  working on exercises     Time  4    Period  Weeks    Status  On-going      PT SHORT TERM GOAL #3   Title  Patient will report centralized lower back pain without left radiculopathy    Baseline  Pain is in her cerntral lower lumbar spine today.     Time  4    Period  Weeks    Status  Achieved      PT SHORT TERM GOAL #4   Title  She will report no LT arm symptoms    Baseline  intermittant    Time  3    Period  Weeks    Status  Achieved      PT SHORT TERM GOAL #5   Title  She will be able to   sit and stand for 20 min or more without increased pain    Baseline  45 min sitting , stand at least 20 min    Time  3    Period  Weeks    Status  On-going        PT Long Term Goals - 02/01/17 1740      PT LONG TERM GOAL #1   Title  Patient will stand for 45 minutes without self report of pain in order to perfrom ADL's     Time  8    Period  Weeks    Status  New      PT LONG TERM GOAL #2   Title  Patient will demostrate a 41% limitation on FOTO     Time  8    Period  Weeks    Status  New      PT LONG TERM GOAL #3   Title  She will report no back pain with home  activties and will able to clean bathroom without pain more than 1-2/10    Time  8    Period  Weeks    Status  New      PT LONG TERM GOAL #4   Title  Patient will increase cervical rotation by 15 degrees in order to improve community safety     Time  8    Period  Weeks    Status  New            Plan - 03/21/17 1610    Clinical Impression Statement  Therapy focused on the patients cervical spine this viisit. She had improved pain with manual therapy. Therapy reviewed supine thera-band exercises. Patients FOTO score improved to a 26% limitation.     Clinical Presentation  Evolving    Clinical Decision Making  Moderate    Rehab Potential  Good    PT Frequency  2x / week    PT Duration   8 weeks    PT Treatment/Interventions  ADLs/Self Care Home Management;Cryotherapy;Electrical Stimulation;Therapeutic exercise;Therapeutic activities;Neuromuscular re-education;Patient/family education;Passive range of motion;Manual techniques;Taping;Dry needling;Moist Heat    PT Next Visit Plan  Continue to focus on whatever is her biggest proglrm. Consider scpaular strengthening exercises with abdominal breathing. Perfrom re-eval at visit 10     PT Home Exercise Plan  wand flexion; LTR ; single knee to chest with towel     Consulted and Agree with Plan of Care  Patient       Patient will benefit from skilled therapeutic intervention in order to improve the following deficits and impairments:  Abnormal gait, Pain, Decreased safety awareness, Decreased knowledge of precautions, Decreased endurance, Decreased range of motion, Increased muscle spasms, Postural dysfunction, Decreased mobility  Visit Diagnosis: Chronic left-sided low back pain with left-sided sciatica  Acute pain of left shoulder  Cervicalgia  Difficulty in walking, not elsewhere classified  Acute bilateral low back pain without sciatica  Pain in left leg     Problem List There are no active problems to display for this patient.   Dessie Coma PT DPT  03/21/2017, 10:21 AM  [facility] [address] [place], [place], [address] Phone: [phone]   Fax:  [phone]  Name: Deborah Stein MRN: [id number] Date of Birth: Feb 11, 1941

## 2017-03-26 ENCOUNTER — Encounter: Payer: Medicare Other | Admitting: Physical Therapy

## 2017-03-28 ENCOUNTER — Encounter: Payer: Medicare Other | Admitting: Physical Therapy

## 2017-04-03 ENCOUNTER — Ambulatory Visit: Payer: Medicare Other | Admitting: Physical Therapy

## 2017-04-05 ENCOUNTER — Ambulatory Visit: Payer: Medicare Other | Admitting: Physical Therapy

## 2017-04-09 ENCOUNTER — Ambulatory Visit: Payer: Medicare Other | Admitting: Physical Therapy

## 2017-04-11 ENCOUNTER — Ambulatory Visit: Payer: Medicare Other | Admitting: Physical Therapy

## 2017-04-16 ENCOUNTER — Ambulatory Visit: Payer: Medicare Other | Attending: Internal Medicine | Admitting: Physical Therapy

## 2017-04-16 ENCOUNTER — Encounter: Payer: Self-pay | Admitting: Physical Therapy

## 2017-04-16 DIAGNOSIS — M545 Low back pain: Secondary | ICD-10-CM | POA: Diagnosis present

## 2017-04-16 DIAGNOSIS — M5442 Lumbago with sciatica, left side: Secondary | ICD-10-CM | POA: Insufficient documentation

## 2017-04-16 DIAGNOSIS — G8929 Other chronic pain: Secondary | ICD-10-CM | POA: Diagnosis present

## 2017-04-16 DIAGNOSIS — M25512 Pain in left shoulder: Secondary | ICD-10-CM | POA: Diagnosis present

## 2017-04-16 DIAGNOSIS — R262 Difficulty in walking, not elsewhere classified: Secondary | ICD-10-CM

## 2017-04-16 DIAGNOSIS — M79605 Pain in left leg: Secondary | ICD-10-CM | POA: Insufficient documentation

## 2017-04-16 DIAGNOSIS — M542 Cervicalgia: Secondary | ICD-10-CM

## 2017-04-16 NOTE — Therapy (Signed)
[place], [place], [address] Phone: [phone]   Fax:  [phone]  Physical Therapy Treatment/ Re-cert   Patient Details  Name: Deborah Stein MRN: [id number] Date of Birth: 1940/07/09 Referring Provider: Dr Jola Baptist    Encounter Date: 04/16/2017  PT End of Session - 04/16/17 1021    Visit Number  12    Number of Visits  16    Date for PT Re-Evaluation  05/14/17    Authorization Time Period  [employer] medicare     PT Start Time  1015    PT Stop Time  1058    PT Time Calculation (min)  43 min    Activity Tolerance  Patient tolerated treatment well    Behavior During Therapy  [facility] for tasks assessed/performed       Past Medical History:  Diagnosis Date  . Environmental allergies   . Hypercholesteremia   . Hypertension     Past Surgical History:  Procedure Laterality Date  . ABDOMINAL HYSTERECTOMY    . TONSILLECTOMY      There were no vitals filed for this visit.  Subjective Assessment - 04/16/17 1021    Subjective  Patient has been out with Bronchitis. She is sore in her ribs from coughing. Her neck and her back are about the same as they have been when they left.     Limitations  Standing;Lifting    How long can you sit comfortably?  about 20 min before she starts feeling it     How long can you stand comfortably?  15-20 minutes before the pain gets too bad     How long can you walk comfortably?  can walk but does not fewel like she can walk too much     Patient Stated Goals  difficulty sitting for long periods of time     Pain Score  3     Pain Location  Neck    Pain Orientation  Left    Pain Descriptors / Indicators  Aching    Pain Type  Chronic pain    Pain Onset  More than a month ago    Pain Frequency  Constant    Aggravating Factors   standing     Pain Relieving Factors  rest     Effect of Pain on Daily Activities  siddiulty perfroming ADL's     Pain Score  4    Pain Location   Back    Pain Orientation  Left    Pain Descriptors / Indicators  Aching    Pain Type  Chronic pain    Pain Onset  More than a month ago    Pain Frequency  Constant    Aggravating Factors   use of the arm     Pain Relieving Factors  rest     Effect of Pain on Daily Activities  difficulty performing ADL's          [facility] PT Assessment - 04/16/17 0001      AROM   Left Shoulder Flexion  130 Degrees with pain     Cervical Flexion  36 pain into the shoulder     Cervical Extension  26 no pain     Cervical - Right Rotation  60    Cervical - Left Rotation  24    Lumbar Flexion  25% limitation       Strength   Left Shoulder Extension  4+/5    Left Shoulder  Internal Rotation  4/5    Left Shoulder External Rotation  4+/5                  [facility] Adult PT Treatment/Exercise - 04/16/17 0001      Lumbar Exercises: Stretches   Passive Hamstring Stretch Limitations  bilateral strap 2x20 sec hold     Single Knee to Chest Stretch  2 reps;20 seconds;Limitations    Lower Trunk Rotation Limitations  x10     Piriformis Stretch  2 reps;20 seconds      Shoulder Exercises: Supine   Other Supine Exercises  band flexion 2x10; horizontal abduction x10; Bilateral ER 2x10       Moist Heat Therapy   Number Minutes Moist Heat  10 Minutes    Moist Heat Location  Lumbar Spine      Manual Therapy   Manual Therapy  Soft tissue mobilization;Passive ROM    Soft tissue mobilization  trigger point release to bilateral upper traps    Manual Traction  manual sub occipital release and gentle traction;              PT Education - 04/16/17 1114    Education provided  Yes    Education Details  reviewed HEp and exercises to focus on    Person(s) Educated  Patient    Methods  Explanation;Demonstration;Verbal cues;Tactile cues    Comprehension  Verbalized understanding;Returned demonstration;Tactile cues required;Verbal cues required       PT Short Term Goals - 04/16/17 1124      PT SHORT  TERM GOAL #1   Title  Pt will be independent with inital HEP    Baseline  doing HEP 2 x per day    Time  4    Period  Weeks    Status  Achieved      PT SHORT TERM GOAL #2   Title  Patient will increase left hip strength and left shoulder strength to 4+/5     Baseline  4/5 hip flexion 4+/5 abduction     Status  Partially Met      PT SHORT TERM GOAL #3   Title  Patient will report centralized lower back pain without left radiculopathy    Baseline  less radicular pain but has returned some since her bronchitis     Time  4    Period  Weeks    Status  On-going      PT SHORT TERM GOAL #4   Title  She will report no LT arm symptoms    Baseline  intermittant    Time  3    Period  Weeks    Status  On-going      PT SHORT TERM GOAL #5   Title  She will be able to   sit and stand for 20 min or more without increased pain    Baseline  45 min sitting , stand at least 20 min    Time  3    Period  Weeks    Status  On-going        PT Long Term Goals - 04/16/17 1126      PT LONG TERM GOAL #1   Title  Patient will stand for 45 minutes without self report of pain in order to perfrom ADL's     Baseline  improved aboility to stand but stil limited     Time  8    Period  Weeks    Status  On-going  PT LONG TERM GOAL #2   Title  Patient will demostrate a 41% limitation on FOTO     Baseline  2% worse then when she went on vacation but still better then baseline     Time  8    Status  On-going      PT LONG TERM GOAL #3   Title  She will report no back pain with home activties and will able to clean bathroom without pain more than 1-2/10    Baseline  can do light activity     Time  8    Period  Weeks    Status  On-going      PT LONG TERM GOAL #4   Title  Patient will increase cervical rotation by 15 degrees in order to improve community safety     Time  8    Period  Weeks    Status  On-going      PT LONG TERM GOAL #5   Title  she ,with support, will be able to sit as needed  for activity with 1-2/10 max pain    Baseline  1/10 today    Time  6    Period  Weeks    Status  Achieved            Plan - 04/16/17 1118    Clinical Impression Statement  Patient has been out of town for a week and a half then sick for another week and a half. In that time she has maintained her exercises. Her pain is about the same as when she left. Her strength and range of motion has improved. She is appraoching her long and short term goals. See below for goal specific progress. She would benefit from futther skilled therapy 2w4 .     Clinical Presentation  Evolving    Clinical Decision Making  Moderate    Rehab Potential  Good    PT Frequency  2x / week    PT Duration  4 weeks    PT Treatment/Interventions  ADLs/Self Care Home Management;Cryotherapy;Electrical Stimulation;Therapeutic exercise;Therapeutic activities;Neuromuscular re-education;Patient/family education;Passive range of motion;Manual techniques;Taping;Dry needling;Moist Heat    PT Next Visit Plan  Continue to focus on whatever is her biggest proglrm. Consider scpaular strengthening exercises with abdominal breathing. Perfrom re-eval at visit 10     PT Home Exercise Plan  wand flexion; LTR ; single knee to chest with towel     Consulted and Agree with Plan of Care  Patient       Patient will benefit from skilled therapeutic intervention in order to improve the following deficits and impairments:  Abnormal gait, Pain, Decreased safety awareness, Decreased knowledge of precautions, Decreased endurance, Decreased range of motion, Increased muscle spasms, Postural dysfunction, Decreased mobility  Visit Diagnosis: Chronic left-sided low back pain with left-sided sciatica - Plan: PT plan of care cert/re-cert  Acute pain of left shoulder - Plan: PT plan of care cert/re-cert  Cervicalgia - Plan: PT plan of care cert/re-cert  Difficulty in walking, not elsewhere classified - Plan: PT plan of care  cert/re-cert     Problem List There are no active problems to display for this patient.   Carney Living 04/16/2017, 11:53 AM  [facility] [address] [place], [place], [address] Phone: [phone]   Fax:  [phone]  Name: NICOL HERBIG MRN: [id number] Date of Birth: 01/20/1941

## 2017-04-18 ENCOUNTER — Encounter: Payer: Self-pay | Admitting: Physical Therapy

## 2017-04-18 ENCOUNTER — Ambulatory Visit: Payer: Medicare Other | Admitting: Physical Therapy

## 2017-04-18 DIAGNOSIS — R262 Difficulty in walking, not elsewhere classified: Secondary | ICD-10-CM

## 2017-04-18 DIAGNOSIS — M542 Cervicalgia: Secondary | ICD-10-CM

## 2017-04-18 DIAGNOSIS — M5442 Lumbago with sciatica, left side: Principal | ICD-10-CM

## 2017-04-18 DIAGNOSIS — M25512 Pain in left shoulder: Secondary | ICD-10-CM

## 2017-04-18 DIAGNOSIS — G8929 Other chronic pain: Secondary | ICD-10-CM

## 2017-04-18 NOTE — Therapy (Signed)
[place], [place], [address] Phone: [phone]   Fax:  [phone]  Physical Therapy Treatment  Patient Details  Name: Deborah Stein MRN: [id number] Date of Birth: 09/21/1940 Referring Provider: Dr Jola Baptist    Encounter Date: 04/18/2017  PT End of Session - 04/18/17 0854    Visit Number  13    Number of Visits  16    Date for PT Re-Evaluation  05/14/17    Authorization Time Period  [employer] medicare     PT Start Time  1015    PT Stop Time  1057    PT Time Calculation (min)  42 min    Activity Tolerance  Patient tolerated treatment well    Behavior During Therapy  [facility] for tasks assessed/performed       Past Medical History:  Diagnosis Date  . Environmental allergies   . Hypercholesteremia   . Hypertension     Past Surgical History:  Procedure Laterality Date  . ABDOMINAL HYSTERECTOMY    . TONSILLECTOMY      There were no vitals filed for this visit.  Subjective Assessment - 04/18/17 0851    Subjective  Patient feels like her back and her neck are about the same. She did not feel good yesterday. she was having an increased cough.     Limitations  Standing;Lifting    How long can you sit comfortably?  about 20 min before she starts feeling it     How long can you stand comfortably?  15-20 minutes before the pain gets too bad     How long can you walk comfortably?  can walk but does not fewel like she can walk too much     Patient Stated Goals  difficulty sitting for long periods of time     Currently in Pain?  Yes    Pain Score  3     Pain Location  Neck    Pain Orientation  Left    Pain Descriptors / Indicators  Aching    Pain Type  Chronic pain    Pain Onset  More than a month ago    Pain Frequency  Constant    Aggravating Factors   standing     Pain Relieving Factors  rest    Multiple Pain Sites  Yes    Pain Score  4    Pain Location  Back    Pain Orientation  Left    Pain  Descriptors / Indicators  Aching    Pain Type  Chronic pain    Pain Onset  More than a month ago    Pain Frequency  Constant    Aggravating Factors   use of the arm    Pain Relieving Factors  rest    Effect of Pain on Daily Activities  difficulty perfroming ADL's                       [facility] Adult PT Treatment/Exercise - 04/18/17 0001      Lumbar Exercises: Stretches   Single Knee to Chest Stretch  2 reps;20 seconds;Limitations    Lower Trunk Rotation Limitations  x10     Piriformis Stretch  2 reps;20 seconds      Shoulder Exercises: Supine   Other Supine Exercises  band flexion 2x10; horizontal abduction x10; Bilateral ER 2x10       Moist Heat Therapy   Number Minutes Moist Heat  10 Minutes  Manual Therapy   Manual Therapy  Soft tissue mobilization;Passive ROM    Soft tissue mobilization  trigger point release to bilateral upper traps and right lowqer lumbar /upper glut area     Manual Traction  manual sub occipital release and gentle traction;              PT Education - 04/18/17 0853    Education provided  Yes    Education Details  reviewed HEP and exercises     Person(s) Educated  Patient    Methods  Explanation;Demonstration;Tactile cues;Verbal cues    Comprehension  Verbalized understanding;Returned demonstration;Verbal cues required;Tactile cues required       PT Short Term Goals - 04/16/17 1124      PT SHORT TERM GOAL #1   Title  Pt will be independent with inital HEP    Baseline  doing HEP 2 x per day    Time  4    Period  Weeks    Status  Achieved      PT SHORT TERM GOAL #2   Title  Patient will increase left hip strength and left shoulder strength to 4+/5     Baseline  4/5 hip flexion 4+/5 abduction     Status  Partially Met      PT SHORT TERM GOAL #3   Title  Patient will report centralized lower back pain without left radiculopathy    Baseline  less radicular pain but has returned some since her bronchitis     Time  4     Period  Weeks    Status  On-going      PT SHORT TERM GOAL #4   Title  She will report no LT arm symptoms    Baseline  intermittant    Time  3    Period  Weeks    Status  On-going      PT SHORT TERM GOAL #5   Title  She will be able to   sit and stand for 20 min or more without increased pain    Baseline  45 min sitting , stand at least 20 min    Time  3    Period  Weeks    Status  On-going        PT Long Term Goals - 04/16/17 1126      PT LONG TERM GOAL #1   Title  Patient will stand for 45 minutes without self report of pain in order to perfrom ADL's     Baseline  improved aboility to stand but stil limited     Time  8    Period  Weeks    Status  On-going      PT LONG TERM GOAL #2   Title  Patient will demostrate a 41% limitation on FOTO     Baseline  2% worse then when she went on vacation but still better then baseline     Time  8    Status  On-going      PT LONG TERM GOAL #3   Title  She will report no back pain with home activties and will able to clean bathroom without pain more than 1-2/10    Baseline  can do light activity     Time  8    Period  Weeks    Status  On-going      PT LONG TERM GOAL #4   Title  Patient will increase cervical rotation by 15 degrees in  order to improve community safety     Time  8    Period  Weeks    Status  On-going      PT LONG TERM GOAL #5   Title  she ,with support, will be able to sit as needed for activity with 1-2/10 max pain    Baseline  1/10 today    Time  6    Period  Weeks    Status  Achieved            Plan - 04/18/17 0859    Clinical Impression Statement  Patient tolerated stretching and exercises well. She had improved pain with stretchin. She was encouraged to continue with her exercises at home.     Clinical Presentation  Evolving    Clinical Decision Making  Moderate    Rehab Potential  Good    PT Frequency  2x / week    PT Duration  4 weeks    PT Treatment/Interventions  ADLs/Self Care Home  Management;Cryotherapy;Electrical Stimulation;Therapeutic exercise;Therapeutic activities;Neuromuscular re-education;Patient/family education;Passive range of motion;Manual techniques;Taping;Dry needling;Moist Heat    PT Next Visit Plan  Continue to focus on whatever is her biggest proglrm. Consider scpaular strengthening exercises with abdominal breathing. Perfrom re-eval at visit 10     PT Home Exercise Plan  wand flexion; LTR ; single knee to chest with towel     Consulted and Agree with Plan of Care  Patient       Patient will benefit from skilled therapeutic intervention in order to improve the following deficits and impairments:  Abnormal gait, Pain, Decreased safety awareness, Decreased knowledge of precautions, Decreased endurance, Decreased range of motion, Increased muscle spasms, Postural dysfunction, Decreased mobility  Visit Diagnosis: Chronic left-sided low back pain with left-sided sciatica  Acute pain of left shoulder  Cervicalgia  Difficulty in walking, not elsewhere classified     Problem List There are no active problems to display for this patient.   Carney Living PT DPT  04/18/2017, 1:23 PM  [facility] [address] [place], [place], [address] Phone: [phone]   Fax:  [phone]  Name: RABECCA BIRGE MRN: [id number] Date of Birth: December 07, 1940

## 2017-04-20 ENCOUNTER — Ambulatory Visit
Admission: RE | Admit: 2017-04-20 | Discharge: 2017-04-20 | Disposition: A | Discharge status: Home / Self Care | Payer: Medicare Other | Source: Ambulatory Visit | Attending: Internal Medicine | Admitting: Internal Medicine

## 2017-04-20 ENCOUNTER — Other Ambulatory Visit: Payer: Self-pay | Admitting: Internal Medicine

## 2017-04-20 DIAGNOSIS — J209 Acute bronchitis, unspecified: Secondary | ICD-10-CM

## 2017-04-23 ENCOUNTER — Ambulatory Visit: Payer: Medicare Other | Admitting: Physical Therapy

## 2017-04-23 DIAGNOSIS — M545 Low back pain, unspecified: Secondary | ICD-10-CM

## 2017-04-23 DIAGNOSIS — G8929 Other chronic pain: Secondary | ICD-10-CM

## 2017-04-23 DIAGNOSIS — M5442 Lumbago with sciatica, left side: Secondary | ICD-10-CM | POA: Diagnosis not present

## 2017-04-23 DIAGNOSIS — R262 Difficulty in walking, not elsewhere classified: Secondary | ICD-10-CM

## 2017-04-23 DIAGNOSIS — M542 Cervicalgia: Secondary | ICD-10-CM

## 2017-04-23 DIAGNOSIS — M25512 Pain in left shoulder: Secondary | ICD-10-CM

## 2017-04-23 DIAGNOSIS — M79605 Pain in left leg: Secondary | ICD-10-CM

## 2017-04-23 NOTE — Therapy (Signed)
[place], [place], [address] Phone: [phone]   Fax:  [phone]  Physical Therapy Treatment  Patient Details  Name: Deborah Stein MRN: [id number] Date of Birth: 1940-07-02 Referring Provider: Dr Jola Baptist    Encounter Date: 04/23/2017  PT End of Session - 04/23/17 0854    Visit Number  13    Number of Visits  19    Date for PT Re-Evaluation  05/14/17    Authorization Time Period  [facility] medicare     PT Start Time  0845    PT Stop Time  0928    PT Time Calculation (min)  43 min    Activity Tolerance  Patient tolerated treatment well    Behavior During Therapy  [facility] for tasks assessed/performed       Past Medical History:  Diagnosis Date  . Environmental allergies   . Hypercholesteremia   . Hypertension     Past Surgical History:  Procedure Laterality Date  . ABDOMINAL HYSTERECTOMY    . TONSILLECTOMY      There were no vitals filed for this visit.  Subjective Assessment - 04/23/17 0940    Subjective  Patients backhas been about the same. Her neck has been about the same too. She has not been able to do much since Friday. She has been at home.     Limitations  Standing;Lifting    How long can you sit comfortably?  about 20 min before she starts feeling it     How long can you stand comfortably?  15-20 minutes before the pain gets too bad     How long can you walk comfortably?  can walk but does not fewel like she can walk too much     Patient Stated Goals  difficulty sitting for long periods of time     Currently in Pain?  Yes    Pain Score  3     Pain Location  Neck    Pain Orientation  Left    Pain Descriptors / Indicators  Aching    Pain Type  Chronic pain    Pain Radiating Towards  going down the leg     Pain Onset  More than a month ago    Pain Frequency  Constant    Aggravating Factors   standing     Pain Relieving Factors  rest     Effect of Pain on Daily Activities   difficulty perfroming ADL's     Pain Score  4    Pain Location  Back    Pain Orientation  Left    Pain Descriptors / Indicators  Aching    Pain Type  Chronic pain    Pain Onset  More than a month ago    Pain Frequency  Constant    Aggravating Factors   use of the arm     Pain Relieving Factors  rest     Effect of Pain on Daily Activities  difficulty perfroming ADL;s                       [facility] Adult PT Treatment/Exercise - 04/23/17 0001      Lumbar Exercises: Stretches   Single Knee to Chest Stretch  2 reps;20 seconds;Limitations    Lower Trunk Rotation Limitations  x10     Piriformis Stretch  2 reps;20 seconds      Lumbar Exercises: Supine   Clam  Limitations  Clam Limitations  2x10 red     Other Supine Lumbar Exercises  ball squeeze x10 with abdominal breathing; Clamshell with abdominal breathing 2x10 green with TA      Other Supine Lumbar Exercises  posterior pelvic tilt x10 5 sec hold       Moist Heat Therapy   Number Minutes Moist Heat  10 Minutes    Moist Heat Location  Lumbar Spine;Cervical      Manual Therapy   Manual Therapy  Soft tissue mobilization;Passive ROM    Soft tissue mobilization  trigger point release to bilateral upper traps and right lowqer lumbar /upper glut area     Manual Traction  manual sub occipital release and gentle traction;              PT Education - 04/23/17 0943    Education provided  Yes    Education Details  reviewed HEP; Reviewed technique with ther-ex     Person(s) Educated  Patient    Methods  Explanation;Demonstration;Tactile cues;Verbal cues    Comprehension  Verbalized understanding;Returned demonstration;Verbal cues required;Tactile cues required       PT Short Term Goals - 04/16/17 1124      PT SHORT TERM GOAL #1   Title  Pt will be independent with inital HEP    Baseline  doing HEP 2 x per day    Time  4    Period  Weeks    Status  Achieved      PT SHORT TERM GOAL #2   Title  Patient will  increase left hip strength and left shoulder strength to 4+/5     Baseline  4/5 hip flexion 4+/5 abduction     Status  Partially Met      PT SHORT TERM GOAL #3   Title  Patient will report centralized lower back pain without left radiculopathy    Baseline  less radicular pain but has returned some since her bronchitis     Time  4    Period  Weeks    Status  On-going      PT SHORT TERM GOAL #4   Title  She will report no LT arm symptoms    Baseline  intermittant    Time  3    Period  Weeks    Status  On-going      PT SHORT TERM GOAL #5   Title  She will be able to   sit and stand for 20 min or more without increased pain    Baseline  45 min sitting , stand at least 20 min    Time  3    Period  Weeks    Status  On-going        PT Long Term Goals - 04/16/17 1126      PT LONG TERM GOAL #1   Title  Patient will stand for 45 minutes without self report of pain in order to perfrom ADL's     Baseline  improved aboility to stand but stil limited     Time  8    Period  Weeks    Status  On-going      PT LONG TERM GOAL #2   Title  Patient will demostrate a 41% limitation on FOTO     Baseline  2% worse then when she went on vacation but still better then baseline     Time  8    Status  On-going      PT  LONG TERM GOAL #3   Title  She will report no back pain with home activties and will able to clean bathroom without pain more than 1-2/10    Baseline  can do light activity     Time  8    Period  Weeks    Status  On-going      PT LONG TERM GOAL #4   Title  Patient will increase cervical rotation by 15 degrees in order to improve community safety     Time  8    Period  Weeks    Status  On-going      PT LONG TERM GOAL #5   Title  she ,with support, will be able to sit as needed for activity with 1-2/10 max pain    Baseline  1/10 today    Time  6    Period  Weeks    Status  Achieved            Plan - 04/23/17 0944    Clinical Impression Statement  Patient is still  not feeling well. Ther-ex was limited today. She had no increase in pain. She had more spasming on the right then the left today in her cervical spine. Therapy will advance her exercises when she starts feeling better.     Clinical Presentation  Evolving    Clinical Decision Making  Moderate    Rehab Potential  Good    PT Frequency  2x / week    PT Duration  4 weeks    PT Treatment/Interventions  ADLs/Self Care Home Management;Cryotherapy;Electrical Stimulation;Therapeutic exercise;Therapeutic activities;Neuromuscular re-education;Patient/family education;Passive range of motion;Manual techniques;Taping;Dry needling;Moist Heat    PT Next Visit Plan  Continue to focus on whatever is her biggest proglrm. Consider scpaular strengthening exercises with abdominal breathing. Perfrom re-eval at visit 10; consdier IASTYM to neck in sitting; consdier standing exercises.     PT Home Exercise Plan  wand flexion; LTR ; single knee to chest with towel     Consulted and Agree with Plan of Care  Patient       Patient will benefit from skilled therapeutic intervention in order to improve the following deficits and impairments:  Abnormal gait, Pain, Decreased safety awareness, Decreased knowledge of precautions, Decreased endurance, Decreased range of motion, Increased muscle spasms, Postural dysfunction, Decreased mobility  Visit Diagnosis: Chronic left-sided low back pain with left-sided sciatica  Acute pain of left shoulder  Cervicalgia  Difficulty in walking, not elsewhere classified  Acute bilateral low back pain without sciatica  Pain in left leg     Problem List There are no active problems to display for this patient.   Carney Living 04/23/2017, 10:15 AM  [facility] [address] [place], [place], [address] Phone: [phone]   Fax:  [phone]  Name: SHANNAN SLINKER MRN: [id number] Date of Birth: 03-23-1940

## 2017-04-25 ENCOUNTER — Ambulatory Visit: Payer: Medicare Other | Admitting: Physical Therapy

## 2017-04-30 ENCOUNTER — Ambulatory Visit: Payer: Medicare Other | Admitting: Physical Therapy

## 2017-05-02 ENCOUNTER — Ambulatory Visit: Payer: Medicare Other | Admitting: Physical Therapy

## 2017-05-02 ENCOUNTER — Encounter: Payer: Self-pay | Admitting: Physical Therapy

## 2017-05-02 DIAGNOSIS — G8929 Other chronic pain: Secondary | ICD-10-CM

## 2017-05-02 DIAGNOSIS — R262 Difficulty in walking, not elsewhere classified: Secondary | ICD-10-CM

## 2017-05-02 DIAGNOSIS — M25512 Pain in left shoulder: Secondary | ICD-10-CM

## 2017-05-02 DIAGNOSIS — M5442 Lumbago with sciatica, left side: Secondary | ICD-10-CM | POA: Diagnosis not present

## 2017-05-02 DIAGNOSIS — M542 Cervicalgia: Secondary | ICD-10-CM

## 2017-05-02 NOTE — Therapy (Signed)
[place], [place], [address] Phone: [phone]   Fax:  [phone]  Physical Therapy Treatment  Patient Details  Name: Deborah Stein MRN: [id number] Date of Birth: 22-Jan-1941 Referring Provider: Dr Jola Baptist    Encounter Date: 05/02/2017  PT End of Session - 05/02/17 1110    Visit Number  14    Number of Visits  19    Date for PT Re-Evaluation  05/14/17    Authorization Time Period  [employer] medicare     PT Start Time  1100    PT Stop Time  1152    PT Time Calculation (min)  52 min    Activity Tolerance  Patient tolerated treatment well    Behavior During Therapy  [facility] for tasks assessed/performed       Past Medical History:  Diagnosis Date  . Environmental allergies   . Hypercholesteremia   . Hypertension     Past Surgical History:  Procedure Laterality Date  . ABDOMINAL HYSTERECTOMY    . TONSILLECTOMY      There were no vitals filed for this visit.  Subjective Assessment - 05/02/17 1104    Subjective  Patient feels like the spot in her neck is getting biggger. She is still feeling the effects of the bronchitis     How long can you sit comfortably?  about 20 min before she starts feeling it     How long can you stand comfortably?  15-20 minutes before the pain gets too bad     How long can you walk comfortably?  can walk but does not fewel like she can walk too much     Patient Stated Goals  difficulty sitting for long periods of time     Currently in Pain?  Yes    Pain Score  4     Pain Location  Back    Pain Orientation  Left    Pain Descriptors / Indicators  Constant;Aching    Pain Type  Acute pain    Pain Onset  More than a month ago    Pain Frequency  Constant    Aggravating Factors   standing     Multiple Pain Sites  Yes    Pain Score  4    Pain Location  Neck    Pain Orientation  Left    Pain Descriptors / Indicators  Aching    Pain Type  Chronic pain    Pain Onset  More  than a month ago    Pain Frequency  Constant    Aggravating Factors   use of the arm     Pain Relieving Factors  rest     Effect of Pain on Daily Activities  difficulty perfroming ADL's                       [facility] Adult PT Treatment/Exercise - 05/02/17 0001      Lumbar Exercises: Stretches   Single Knee to Chest Stretch  2 reps;20 seconds;Limitations    Lower Trunk Rotation Limitations  x10     Piriformis Stretch  2 reps;20 seconds      Lumbar Exercises: Supine   Clam  Limitations    Clam Limitations  2x10 green     Bent Knee Raise  Limitations    Bent Knee Raise Limitations  2x10     Other Supine Lumbar Exercises  ball squeeze 2x10 with abdominal breathing; Clamshell with abdominal  breathing 2x10 green with TA        Shoulder Exercises: Supine   Other Supine Exercises  band flexion 2x10; horizontal abduction x10; Bilateral ER 2x10       Moist Heat Therapy   Number Minutes Moist Heat  10 Minutes    Moist Heat Location  Lumbar Spine;Cervical      Manual Therapy   Manual Therapy  Soft tissue mobilization;Passive ROM    Soft tissue mobilization  trigger point release to bilateral upper traps     Manual Traction  manual sub occipital release and gentle traction;              PT Education - 05/02/17 1109    Education provided  Yes    Education Details  reviewed th importance of exercises     Person(s) Educated  Patient    Methods  Explanation;Demonstration;Tactile cues;Verbal cues    Comprehension  Verbalized understanding;Returned demonstration;Verbal cues required;Tactile cues required       PT Short Term Goals - 04/16/17 1124      PT SHORT TERM GOAL #1   Title  Pt will be independent with inital HEP    Baseline  doing HEP 2 x per day    Time  4    Period  Weeks    Status  Achieved      PT SHORT TERM GOAL #2   Title  Patient will increase left hip strength and left shoulder strength to 4+/5     Baseline  4/5 hip flexion 4+/5 abduction      Status  Partially Met      PT SHORT TERM GOAL #3   Title  Patient will report centralized lower back pain without left radiculopathy    Baseline  less radicular pain but has returned some since her bronchitis     Time  4    Period  Weeks    Status  On-going      PT SHORT TERM GOAL #4   Title  She will report no LT arm symptoms    Baseline  intermittant    Time  3    Period  Weeks    Status  On-going      PT SHORT TERM GOAL #5   Title  She will be able to   sit and stand for 20 min or more without increased pain    Baseline  45 min sitting , stand at least 20 min    Time  3    Period  Weeks    Status  On-going        PT Long Term Goals - 05/02/17 1205      PT LONG TERM GOAL #1   Title  Patient will stand for 45 minutes without self report of pain in order to perfrom ADL's     Baseline  still limited     Time  8    Period  Weeks    Status  On-going      PT LONG TERM GOAL #2   Title  Patient will demostrate a 41% limitation on FOTO     Baseline  will assess soon     Time  8    Period  Weeks    Status  On-going      PT LONG TERM GOAL #3   Title  She will report no back pain with home activties and will able to clean bathroom without pain more than 1-2/10    Baseline  can do light activity     Time  8    Period  Weeks    Status  On-going      PT LONG TERM GOAL #4   Title  Patient will increase cervical rotation by 15 degrees in order to improve community safety     Time  8    Period  Weeks    Status  On-going      PT LONG TERM GOAL #5   Title  she ,with support, will be able to sit as needed for activity with 1-2/10 max pain    Baseline  1/10 today    Time  6    Period  Weeks    Status  On-going            Plan - 05/02/17 1202    Clinical Impression Statement  Patient was able to perfrom her full exercise rotuine today. Her painhas been consitent but that may be good considering she has had a hard time getting around the house and a large amount of  coughing from the bronchitis. She reported feeling looser after the manual therapy to her neck. She was encouraged to continue with her stretching and exercises at home.     Clinical Presentation  Evolving    Clinical Presentation due to:  increasing over the past few weeks     Clinical Decision Making  Moderate    Rehab Potential  Good    PT Frequency  2x / week    PT Duration  4 weeks    PT Treatment/Interventions  ADLs/Self Care Home Management;Cryotherapy;Electrical Stimulation;Therapeutic exercise;Therapeutic activities;Neuromuscular re-education;Patient/family education;Passive range of motion;Manual techniques;Taping;Dry needling;Moist Heat    PT Next Visit Plan  Continue to focus on whatever is her biggest problem . Consider scapular strengthening exercises with abdominal breathing. Consider IASTMY for the neck and back; consdier standing exercises.     PT Home Exercise Plan  wand flexion; LTR ; single knee to chest with towel     Consulted and Agree with Plan of Care  Patient       Patient will benefit from skilled therapeutic intervention in order to improve the following deficits and impairments:  Abnormal gait, Pain, Decreased safety awareness, Decreased knowledge of precautions, Decreased endurance, Decreased range of motion, Increased muscle spasms, Postural dysfunction, Decreased mobility  Visit Diagnosis: Chronic left-sided low back pain with left-sided sciatica  Acute pain of left shoulder  Cervicalgia  Difficulty in walking, not elsewhere classified     Problem List There are no active problems to display for this patient.   Carney Living PT DPT  05/02/2017, 12:20 PM  [facility] [address] [place], [place], [address] Phone: [phone]   Fax:  [phone]  Name: Deborah Stein MRN: [id number] Date of Birth: August 28, 1940

## 2017-05-07 ENCOUNTER — Ambulatory Visit: Payer: Medicare Other | Attending: Internal Medicine | Admitting: Physical Therapy

## 2017-05-07 DIAGNOSIS — R262 Difficulty in walking, not elsewhere classified: Secondary | ICD-10-CM | POA: Insufficient documentation

## 2017-05-07 DIAGNOSIS — M542 Cervicalgia: Secondary | ICD-10-CM | POA: Diagnosis present

## 2017-05-07 DIAGNOSIS — M5442 Lumbago with sciatica, left side: Secondary | ICD-10-CM | POA: Diagnosis present

## 2017-05-07 DIAGNOSIS — M25512 Pain in left shoulder: Secondary | ICD-10-CM | POA: Insufficient documentation

## 2017-05-07 DIAGNOSIS — G8929 Other chronic pain: Secondary | ICD-10-CM | POA: Diagnosis present

## 2017-05-07 NOTE — Therapy (Signed)
[facility] Outpatient Rehabilitation [facility] [address] [place], [place], [address] Phone: [phone]   Fax:  [phone]  Physical Therapy Treatment  Patient Details  Name: Deborah Stein MRN: [id number] Date of Birth: 1940/09/30 Referring Provider: Dr Danae Chen    Encounter Date: 05/07/2017  PT End of Session - 05/07/17 0851    Visit Number  15    Number of Visits  19    Date for PT Re-Evaluation  05/14/17    Authorization Time Period  [facility] medicare     PT Start Time  0845    PT Stop Time  0938    PT Time Calculation (min)  53 min    Activity Tolerance  Patient tolerated treatment well    Behavior During Therapy  [facility] for tasks assessed/performed       Past Medical History:  Diagnosis Date  . Environmental allergies   . Hypercholesteremia   . Hypertension     Past Surgical History:  Procedure Laterality Date  . ABDOMINAL HYSTERECTOMY    . TONSILLECTOMY      There were no vitals filed for this visit.  Subjective Assessment - 05/07/17 0848    Subjective  Patient feels like her back and neck are a little better. She feels like her couch is better.     Limitations  Standing;Lifting    How long can you sit comfortably?  about 20 min before she starts feeling it     How long can you stand comfortably?  15-20 minutes before the pain gets too bad     How long can you walk comfortably?  can walk but does not fewel like she can walk too much     Patient Stated Goals  difficulty sitting for long periods of time     Currently in Pain?  Yes    Pain Score  2     Pain Location  Back    Pain Orientation  Left    Pain Descriptors / Indicators  Constant;Aching    Pain Type  Acute pain    Pain Onset  More than a month ago    Pain Frequency  Constant    Aggravating Factors   standing     Pain Relieving Factors  rest    Effect of Pain on Daily Activities  difficulty perfroming     Multiple Pain Sites  Yes    Pain Score  2    Pain Orientation  Left    Pain Descriptors / Indicators  Aching    Pain Type  Chronic pain    Pain Onset  More than a month ago    Pain Frequency  Constant    Aggravating Factors   use of the arm     Pain Relieving Factors  rest     Effect of Pain on Daily Activities  difficulty perfroming ADL;s                       [facility] Adult PT Treatment/Exercise - 05/07/17 0001      Lumbar Exercises: Stretches   Single Knee to Chest Stretch  2 reps;20 seconds;Limitations    Lower Trunk Rotation Limitations  x10     Piriformis Stretch  2 reps;20 seconds      Lumbar Exercises: Standing   Other Standing Lumbar Exercises  shoulder extension red 2x10; scap retraction 2x10 both with cuing for abdominal brace       Lumbar Exercises: Supine   Clam  Limitations    Clam Limitations  2x10 green     Bent Knee Raise  Limitations    Bent Knee Raise Limitations  2x10     Other Supine Lumbar Exercises  ball squeeze 2x10 with abdominal breathing; Clamshell with abdominal breathing 2x10 green with TA        Manual Therapy   Manual Therapy  Soft tissue mobilization;Passive ROM    Soft tissue mobilization  trigger point release to upper glutes and left paraspinals.              PT Education - 05/07/17 0851    Education provided  Yes    Education Details  reviewed exercises     Person(s) Educated  Patient    Methods  Explanation;Demonstration;Tactile cues    Comprehension  Verbalized understanding;Returned demonstration;Verbal cues required;Tactile cues required       PT Short Term Goals - 05/07/17 0908      PT SHORT TERM GOAL #1   Title  Pt will be independent with inital HEP    Baseline  doing HEP 2 x per day    Time  4    Period  Weeks    Status  Achieved      PT SHORT TERM GOAL #2   Title  Patient will increase left hip strength and left shoulder strength to 4+/5     Baseline  4/5 hip flexion 4+/5 abduction     Time  4    Period  Weeks    Status  On-going      PT SHORT TERM GOAL #3   Title   Patient will report centralized lower back pain without left radiculopathy    Baseline  less radicular pain but has returned some since her bronchitis     Time  4    Period  Weeks    Status  On-going      PT SHORT TERM GOAL #4   Title  She will report no LT arm symptoms    Baseline  intermittant    Time  3    Period  Weeks    Status  On-going        PT Long Term Goals - 05/02/17 1205      PT LONG TERM GOAL #1   Title  Patient will stand for 45 minutes without self report of pain in order to perfrom ADL's     Baseline  still limited     Time  8    Period  Weeks    Status  On-going      PT LONG TERM GOAL #2   Title  Patient will demostrate a 41% limitation on FOTO     Baseline  will assess soon     Time  8    Period  Weeks    Status  On-going      PT LONG TERM GOAL #3   Title  She will report no back pain with home activties and will able to clean bathroom without pain more than 1-2/10    Baseline  can do light activity     Time  8    Period  Weeks    Status  On-going      PT LONG TERM GOAL #4   Title  Patient will increase cervical rotation by 15 degrees in order to improve community safety     Time  8    Period  Weeks    Status  On-going  PT LONG TERM GOAL #5   Title  she ,with support, will be able to sit as needed for activity with 1-2/10 max pain    Baseline  1/10 today    Time  6    Period  Weeks    Status  On-going            Plan - 05/07/17 0907    Clinical Impression Statement  Patient has been feeling better and beenable to complete her exercises better at home. He pain lebvel has decreased. Therapy continues to work on trigger points in her neck and back     Clinical Presentation  Evolving    Rehab Potential  Good    PT Frequency  2x / week    PT Duration  4 weeks    PT Treatment/Interventions  ADLs/Self Care Home Management;Cryotherapy;Electrical Stimulation;Therapeutic exercise;Therapeutic activities;Neuromuscular  re-education;Patient/family education;Passive range of motion;Manual techniques;Taping;Dry needling;Moist Heat    PT Next Visit Plan  Continue to focus on whatever is her biggest problem . Consider scapular strengthening exercises with abdominal breathing. Consider IASTMY for the neck and back; consdier standing exercises.     PT Home Exercise Plan  wand flexion; LTR ; single knee to chest with towel     Consulted and Agree with Plan of Care  Patient       Patient will benefit from skilled therapeutic intervention in order to improve the following deficits and impairments:  Abnormal gait, Pain, Decreased safety awareness, Decreased knowledge of precautions, Decreased endurance, Decreased range of motion, Increased muscle spasms, Postural dysfunction, Decreased mobility  Visit Diagnosis: Chronic left-sided low back pain with left-sided sciatica  Acute pain of left shoulder  Cervicalgia  Difficulty in walking, not elsewhere classified     Problem List There are no active problems to display for this patient.   Dessie Coma PT DPT  05/07/2017, 9:54 AM  [facility] [address] [place], [place], [address] Phone: [phone]   Fax:  [phone]  Name: AROHI SALVATIERRA MRN: [id number] Date of Birth: [birth year]

## 2017-05-09 ENCOUNTER — Encounter: Payer: Medicare Other | Admitting: Physical Therapy

## 2017-05-14 ENCOUNTER — Ambulatory Visit: Payer: Medicare Other | Admitting: Physical Therapy

## 2017-05-14 ENCOUNTER — Encounter: Payer: Self-pay | Admitting: Physical Therapy

## 2017-05-14 DIAGNOSIS — M5442 Lumbago with sciatica, left side: Secondary | ICD-10-CM | POA: Diagnosis not present

## 2017-05-14 DIAGNOSIS — G8929 Other chronic pain: Secondary | ICD-10-CM

## 2017-05-14 DIAGNOSIS — M542 Cervicalgia: Secondary | ICD-10-CM

## 2017-05-14 DIAGNOSIS — R262 Difficulty in walking, not elsewhere classified: Secondary | ICD-10-CM

## 2017-05-14 DIAGNOSIS — M25512 Pain in left shoulder: Secondary | ICD-10-CM

## 2017-05-14 NOTE — Therapy (Signed)
[facility] Outpatient Rehabilitation [facility] [address] [place], [place], [address] Phone: [phone]   Fax:  [phone]  Physical Therapy Treatment/ Recert   Patient Details  Name: Deborah Stein MRN: [id number] Date of Birth: 01-25-1941 Referring Provider: Dr Danae Chen    Encounter Date: 05/14/2017  PT End of Session - 05/14/17 0854    Visit Number  16    Number of Visits  22    Date for PT Re-Evaluation  06/04/17    Authorization Time Period  [facility] medicare     PT Start Time  0845    PT Stop Time  0927    PT Time Calculation (min)  42 min    Activity Tolerance  Patient tolerated treatment well    Behavior During Therapy  [facility] for tasks assessed/performed       Past Medical History:  Diagnosis Date  . Environmental allergies   . Hypercholesteremia   . Hypertension     Past Surgical History:  Procedure Laterality Date  . ABDOMINAL HYSTERECTOMY    . TONSILLECTOMY      There were no vitals filed for this visit.  Subjective Assessment - 05/14/17 0848    Subjective  Patient reports her neck and back are feeling better. She is coughing less.     Limitations  Standing;Lifting    How long can you sit comfortably?  about 20 min before she starts feeling it     How long can you stand comfortably?  15-20 minutes before the pain gets too bad     How long can you walk comfortably?  can walk but does not fewel like she can walk too much     Patient Stated Goals  difficulty sitting for long periods of time     Currently in Pain?  Yes    Pain Score  2     Pain Location  Back    Pain Orientation  Left    Pain Type  Acute pain    Pain Onset  More than a month ago    Pain Frequency  Constant    Aggravating Factors   standing     Pain Relieving Factors  rest     Effect of Pain on Daily Activities  difficuty perfroming daily tasks     Pain Score  2    Pain Orientation  Left    Pain Descriptors / Indicators  Aching    Pain Type  Chronic pain    Pain  Onset  More than a month ago    Pain Frequency  Constant    Aggravating Factors   use pof the arm     Pain Relieving Factors  rest     Effect of Pain on Daily Activities  difficulty perfroming AD:'s          [facility] PT Assessment - 05/14/17 0001      AROM   Left Shoulder Flexion  135 Degrees minimal pain reported     Cervical Flexion  40    Cervical Extension  42    Cervical - Right Rotation  60    Cervical - Left Rotation  44      PROM   Left Shoulder Flexion  143 Degrees      Strength   Left Shoulder Extension  4+/5    Left Shoulder Internal Rotation  4/5    Left Shoulder External Rotation  4+/5    Left Hip Flexion  4/5    Left Hip  ABduction  4/5    Left Hip ADduction  4/5      Palpation   Palpation comment  spasming of upper trap; spasming of the lumbar spine into the lateral hip                  [facility] Adult PT Treatment/Exercise - 05/14/17 0001      Lumbar Exercises: Stretches   Single Knee to Chest Stretch  2 reps;20 seconds;Limitations    Lower Trunk Rotation Limitations  x10     Piriformis Stretch  2 reps;20 seconds      Lumbar Exercises: Standing   Other Standing Lumbar Exercises  shoulder extension red 2x10; scap retraction 2x10 both with cuing for abdominal brace       Lumbar Exercises: Supine   Clam  Limitations    Clam Limitations  2x10 green     Bent Knee Raise  Limitations    Bent Knee Raise Limitations  2x10     Other Supine Lumbar Exercises  ball squeeze 2x10 with abdominal breathing; Clamshell with abdominal breathing 2x10 green with TA        Shoulder Exercises: Supine   Other Supine Exercises  band flexion 2x10; horizontal abduction x10; Bilateral ER 2x10       Moist Heat Therapy   Number Minutes Moist Heat  10 Minutes    Moist Heat Location  Lumbar Spine;Cervical      Manual Therapy   Manual Therapy  Soft tissue mobilization;Passive ROM    Soft tissue mobilization  trigger point release to upper glutes and left paraspinals.               PT Education - 05/14/17 0851    Education provided  Yes    Education Details  reviewed symptom mangement with exercises.     Person(s) Educated  Patient    Methods  Explanation;Demonstration;Tactile cues    Comprehension  Verbalized understanding;Returned demonstration;Verbal cues required;Tactile cues required       PT Short Term Goals - 05/14/17 1046      PT SHORT TERM GOAL #1   Title  Pt will be independent with inital HEP    Baseline  doing HEP 2 x per day    Time  4    Period  Weeks    Status  Achieved      PT SHORT TERM GOAL #2   Title  Patient will increase left hip strength and left shoulder strength to 4+/5     Baseline  4/5 hip flexion 4+/5 abduction     Time  4    Period  Weeks    Status  On-going      PT SHORT TERM GOAL #3   Title  Patient will report centralized lower back pain without left radiculopathy    Baseline  less radicular pain but has returned some since her bronchitis     Time  4    Period  Weeks    Status  On-going      PT SHORT TERM GOAL #5   Title  She will be able to   sit and stand for 20 min or more without increased pain    Baseline  45 min sitting , stand at least 20 min    Time  3    Period  Weeks    Status  On-going        PT Long Term Goals - 05/02/17 1205      PT LONG TERM GOAL #  1   Title  Patient will stand for 45 minutes without self report of pain in order to perfrom ADL's     Baseline  still limited     Time  8    Period  Weeks    Status  On-going      PT LONG TERM GOAL #2   Title  Patient will demostrate a 41% limitation on FOTO     Baseline  will assess soon     Time  8    Period  Weeks    Status  On-going      PT LONG TERM GOAL #3   Title  She will report no back pain with home activties and will able to clean bathroom without pain more than 1-2/10    Baseline  can do light activity     Time  8    Period  Weeks    Status  On-going      PT LONG TERM GOAL #4   Title  Patient will increase  cervical rotation by 15 degrees in order to improve community safety     Time  8    Period  Weeks    Status  On-going      PT LONG TERM GOAL #5   Title  she ,with support, will be able to sit as needed for activity with 1-2/10 max pain    Baseline  1/10 today    Time  6    Period  Weeks    Status  On-going            Plan - 05/14/17 1036    Clinical Impression Statement  Sicne the patients cough has improved her symptoms have improved as well. her pain is down to a 2/10. She continues to have spasming in her upper trap aand lower     Clinical Presentation  Evolving    Clinical Decision Making  Moderate    PT Frequency  2x / week    PT Duration  4 weeks    PT Treatment/Interventions  ADLs/Self Care Home Management;Cryotherapy;Electrical Stimulation;Therapeutic exercise;Therapeutic activities;Neuromuscular re-education;Patient/family education;Passive range of motion;Manual techniques;Taping;Dry needling;Moist Heat    PT Next Visit Plan  Continue to focus on whatever is her biggest problem . Consider scapular strengthening exercises with abdominal breathing. Consider IASTMY for the neck and back; consdier standing exercises.     PT Home Exercise Plan  wand flexion; LTR ; single knee to chest with towel     Consulted and Agree with Plan of Care  Patient       Patient will benefit from skilled therapeutic intervention in order to improve the following deficits and impairments:  Abnormal gait, Pain, Decreased safety awareness, Decreased knowledge of precautions, Decreased endurance, Decreased range of motion, Increased muscle spasms, Postural dysfunction, Decreased mobility  Visit Diagnosis: Chronic left-sided low back pain with left-sided sciatica  Acute pain of left shoulder  Cervicalgia  Difficulty in walking, not elsewhere classified     Problem List There are no active problems to display for this patient.   Dessie Comaavid J Emerson Schreifels PT DPT  05/14/2017, 10:48 AM  [facility] [address] [place], [place], [address] Phone: [phone]   Fax:  [phone]  Name: Russ HaloDiane M Pawloski MRN: [id number] Date of Birth: [birth year]

## 2017-05-16 ENCOUNTER — Encounter: Payer: Self-pay | Admitting: Physical Therapy

## 2017-05-16 ENCOUNTER — Ambulatory Visit: Payer: Medicare Other | Admitting: Physical Therapy

## 2017-05-16 DIAGNOSIS — M25512 Pain in left shoulder: Secondary | ICD-10-CM

## 2017-05-16 DIAGNOSIS — R262 Difficulty in walking, not elsewhere classified: Secondary | ICD-10-CM

## 2017-05-16 DIAGNOSIS — M542 Cervicalgia: Secondary | ICD-10-CM

## 2017-05-16 DIAGNOSIS — G8929 Other chronic pain: Secondary | ICD-10-CM

## 2017-05-16 DIAGNOSIS — M5442 Lumbago with sciatica, left side: Secondary | ICD-10-CM | POA: Diagnosis not present

## 2017-05-16 NOTE — Therapy (Signed)
[facility] [address] [place], [place], [address] Phone: [phone]   Fax:  [phone]  Physical Therapy Treatment  Patient Details  Name: Deborah HaloDiane M Stein MRN: [id number] Date of Birth: 1940/12/14 Referring Provider: Dr Danae ChenJohn Griffen    Encounter Date: 05/16/2017  PT End of Session - 05/16/17 0853    Visit Number  17    Number of Visits  22    Date for PT Re-Evaluation  06/04/17    Authorization Time Period  [facility]     PT Start Time  0845    PT Stop Time  0926    PT Time Calculation (min)  41 min    Activity Tolerance  Patient tolerated treatment well    Behavior During Therapy  [facility] for tasks assessed/performed       Past Medical History:  Diagnosis Date  . Environmental allergies   . Hypercholesteremia   . Hypertension     Past Surgical History:  Procedure Laterality Date  . ABDOMINAL HYSTERECTOMY    . TONSILLECTOMY      There were no vitals filed for this visit.  Subjective Assessment - 05/16/17 0850    Subjective  Patient reports some muscle soreness in her back after last visit.     How long can you sit comfortably?  about 20 min before she starts feeling it     How long can you stand comfortably?  15-20 minutes before the pain gets too bad     How long can you walk comfortably?  can walk but does not fewel like she can walk too much     Patient Stated Goals  difficulty sitting for long periods of time     Currently in Pain?  Yes    Pain Score  2     Pain Location  Back    Pain Orientation  Left    Pain Descriptors / Indicators  Constant    Pain Type  Acute pain    Pain Radiating Towards  going down the leg     Pain Onset  More than a month ago    Pain Frequency  Constant    Aggravating Factors   standing     Pain Relieving Factors  rest     Effect of Pain on Daily Activities  difficulty perfroming daily tasks     Multiple Pain Sites  Yes    Pain Score  2    Pain Location  Neck    Pain  Orientation  Left    Pain Descriptors / Indicators  Aching    Pain Type  Chronic pain    Pain Onset  More than a month ago    Pain Frequency  Constant    Aggravating Factors   use of the arm     Pain Relieving Factors  rest     Effect of Pain on Daily Activities  difficulty performing ADL's                       [facility] Adult PT Treatment/Exercise - 05/16/17 0001      Lumbar Exercises: Stretches   Single Knee to Chest Stretch  2 reps;20 seconds;Limitations    Lower Trunk Rotation Limitations  x10     Piriformis Stretch  2 reps;20 seconds      Lumbar Exercises: Supine   Clam  Limitations    Clam Limitations  2x10 green     Bent Knee Raise  Limitations  Bent Knee Raise Limitations  2x10     Bridge  10 reps    Straight Leg Raise  10 reps;Limitations    Straight Leg Raises Limitations  bilateal     Other Supine Lumbar Exercises  ball squeeze 2x10 with abdominal breathing; Clamshell with abdominal breathing 2x10 green with TA        Moist Heat Therapy   Number Minutes Moist Heat  10 Minutes    Moist Heat Location  Lumbar Spine;Cervical      Manual Therapy   Manual Therapy  Soft tissue mobilization;Passive ROM    Soft tissue mobilization  trigger point release to upper glutes and left paraspinals. IASTYM to lumbar spine              PT Education - 05/16/17 0853    Education provided  Yes    Education Details  reviewed technique with back exercises     Person(s) Educated  Patient    Methods  Explanation;Demonstration;Tactile cues    Comprehension  Verbalized understanding;Returned demonstration;Verbal cues required;Tactile cues required       PT Short Term Goals - 05/14/17 1046      PT SHORT TERM GOAL #1   Title  Pt will be independent with inital HEP    Baseline  doing HEP 2 x per day    Time  4    Period  Weeks    Status  Achieved      PT SHORT TERM GOAL #2   Title  Patient will increase left hip strength and left shoulder strength to 4+/5      Baseline  4/5 hip flexion 4+/5 abduction     Time  4    Period  Weeks    Status  On-going      PT SHORT TERM GOAL #3   Title  Patient will report centralized lower back pain without left radiculopathy    Baseline  less radicular pain but has returned some since her bronchitis     Time  4    Period  Weeks    Status  On-going      PT SHORT TERM GOAL #5   Title  She will be able to   sit and stand for 20 min or more without increased pain    Baseline  45 min sitting , stand at least 20 min    Time  3    Period  Weeks    Status  On-going        PT Long Term Goals - 05/02/17 1205      PT LONG TERM GOAL #1   Title  Patient will stand for 45 minutes without self report of pain in order to perfrom ADL's     Baseline  still limited     Time  8    Period  Weeks    Status  On-going      PT LONG TERM GOAL #2   Title  Patient will demostrate a 41% limitation on FOTO     Baseline  will assess soon     Time  8    Period  Weeks    Status  On-going      PT LONG TERM GOAL #3   Title  She will report no back pain with home activties and will able to clean bathroom without pain more than 1-2/10    Baseline  can do light activity     Time  8    Period  Weeks    Status  On-going      PT LONG TERM GOAL #4   Title  Patient will increase cervical rotation by 15 degrees in order to improve community safety     Time  8    Period  Weeks    Status  On-going      PT LONG TERM GOAL #5   Title  she ,with support, will be able to sit as needed for activity with 1-2/10 max pain    Baseline  1/10 today    Time  6    Period  Weeks    Status  On-going            Plan - 05/16/17 0854    Clinical Impression Statement  Therapy added SLR and briding for the back. She felt a little tightness with new exerciuses. Therapy also used IASTM to the left quadrautus. She reported feeling it release. No pain after the treatment. therapy will continue to progress the apatient as tolerated.      Clinical Presentation  Evolving    Clinical Decision Making  Moderate    Rehab Potential  Good    PT Frequency  2x / week    PT Duration  4 weeks    PT Treatment/Interventions  ADLs/Self Care Home Management;Cryotherapy;Electrical Stimulation;Therapeutic exercise;Therapeutic activities;Neuromuscular re-education;Patient/family education;Passive range of motion;Manual techniques;Taping;Dry needling;Moist Heat    PT Next Visit Plan  Continue to focus on whatever is her biggest problem . Consider scapular strengthening exercises with abdominal breathing. Consider IASTMY for the neck and back; consdier standing exercises.     PT Home Exercise Plan  wand flexion; LTR ; single knee to chest with towel     Consulted and Agree with Plan of Care  Patient       Patient will benefit from skilled therapeutic intervention in order to improve the following deficits and impairments:  Abnormal gait, Pain, Decreased safety awareness, Decreased knowledge of precautions, Decreased endurance, Decreased range of motion, Increased muscle spasms, Postural dysfunction, Decreased mobility  Visit Diagnosis: Chronic left-sided low back pain with left-sided sciatica  Acute pain of left shoulder  Difficulty in walking, not elsewhere classified  Cervicalgia     Problem List There are no active problems to display for this patient.   Deborah Stein PT DPT  05/16/2017, 9:43 AM  [facility] [address] [place], [place], [address] Phone: [phone]   Fax:  [phone]  Name: CHERRYL BABIN MRN: [id number] Date of Birth: January 17, 1941

## 2017-05-21 ENCOUNTER — Ambulatory Visit: Payer: Medicare Other | Admitting: Physical Therapy

## 2017-05-23 ENCOUNTER — Ambulatory Visit: Payer: Medicare Other | Admitting: Physical Therapy

## 2017-05-23 DIAGNOSIS — M542 Cervicalgia: Secondary | ICD-10-CM

## 2017-05-23 DIAGNOSIS — M5442 Lumbago with sciatica, left side: Principal | ICD-10-CM

## 2017-05-23 DIAGNOSIS — M25512 Pain in left shoulder: Secondary | ICD-10-CM

## 2017-05-23 DIAGNOSIS — R262 Difficulty in walking, not elsewhere classified: Secondary | ICD-10-CM

## 2017-05-23 DIAGNOSIS — G8929 Other chronic pain: Secondary | ICD-10-CM

## 2017-05-23 NOTE — Therapy (Signed)
[facility]patient [facility] [address] [place], [place], [address] Phone: [phone]   Fax:  [phone]  Physical Therapy Treatment  Patient Details  Name: Deborah Stein MRN: [id number] Date of Birth: December 25, 1940 Referring Provider: Dr Danae Chen    Encounter Date: 05/23/2017  PT End of Session - 05/23/17 1536    Visit Number  18    Number of Visits  22    Date for PT Re-Evaluation  06/04/17    Authorization Time Period  [facility] medicare     PT Start Time  [phone]    PT Stop Time  0940    PT Time Calculation (min)  50 min    Activity Tolerance  Patient tolerated treatment well    Behavior During Therapy  [facility] for tasks assessed/performed       Past Medical History:  Diagnosis Date  . Environmental allergies   . Hypercholesteremia   . Hypertension     Past Surgical History:  Procedure Laterality Date  . ABDOMINAL HYSTERECTOMY    . TONSILLECTOMY      There were no vitals filed for this visit.  Subjective Assessment - 05/23/17 0850    Subjective  Pt reports pain in the left jaw and neck post dental visit due to a broken crown. Pt reports neck pain is at a 2/10. Pt reports no lower back pain today. Pt still not attending church. Pt has been walking the dogs. in moderation.     How long can you sit comfortably?  about 20 min before she starts feeling it     How long can you stand comfortably?  15-20 minutes before the pain gets too bad     How long can you walk comfortably?  can walk but does not fewel like she can walk too much     Currently in Pain?  No/denies    Pain Score  0-No pain    Pain Score  2    Pain Location  Neck    Pain Orientation  Left    Pain Descriptors / Indicators  Aching    Pain Type  Chronic pain    Pain Onset  More than a month ago    Pain Frequency  Constant    Aggravating Factors   use of the arm     Pain Relieving Factors  rest    Effect of Pain on Daily Activities  difficulty performing ADL's                       [facility] Adult PT Treatment/Exercise - 05/23/17 0001      Shoulder Exercises: Supine   Other Supine Exercises   horizontal abduction 2x10; Bilateral ER 2x10; yellow    Other Supine Exercises  --      Moist Heat Therapy   Number Minutes Moist Heat  10 Minutes    Moist Heat Location  Lumbar Spine;Cervical      Manual Therapy   Soft tissue mobilization  trigger point release to left paraspinals. IASTYM to left cervical paraspinals    Manual Traction  manual sub occipital release and gentle traction;       Neck Exercises: Stretches   Upper Trapezius Stretch  2 reps;20 seconds;Left    Levator Stretch  2 reps;20 seconds;Left    Other Neck Stretches  L Scalene 2X 20 sec             PT Education - 05/23/17 1536    Education  provided  Yes    Education Details  reviewed cervical stretching     Person(s) Educated  Patient    Methods  Explanation;Demonstration;Tactile cues;Verbal cues    Comprehension  Verbalized understanding;Returned demonstration;Verbal cues required;Tactile cues required       PT Short Term Goals - 05/23/17 1617      PT SHORT TERM GOAL #1   Title  Pt will be independent with inital HEP    Baseline  doing HEP 2 x per day    Time  4    Period  Weeks    Status  Achieved      PT SHORT TERM GOAL #2   Title  Patient will increase left hip strength and left shoulder strength to 4+/5     Baseline  4/5 hip flexion 4+/5 abduction     Time  4    Period  Weeks    Status  Achieved      PT SHORT TERM GOAL #3   Title  Patient will report centralized lower back pain without left radiculopathy    Baseline  No radiating back pain today     Time  4    Period  Weeks    Status  Achieved      PT SHORT TERM GOAL #4   Title  She will report no LT arm symptoms    Baseline  no left arm symptoms     Time  3    Period  Weeks    Status  Achieved        PT Long Term Goals - 05/02/17 1205      PT LONG TERM GOAL #1   Title  Patient will  stand for 45 minutes without self report of pain in order to perfrom ADL's     Baseline  still limited     Time  8    Period  Weeks    Status  On-going      PT LONG TERM GOAL #2   Title  Patient will demostrate a 41% limitation on FOTO     Baseline  will assess soon     Time  8    Period  Weeks    Status  On-going      PT LONG TERM GOAL #3   Title  She will report no back pain with home activties and will able to clean bathroom without pain more than 1-2/10    Baseline  can do light activity     Time  8    Period  Weeks    Status  On-going      PT LONG TERM GOAL #4   Title  Patient will increase cervical rotation by 15 degrees in order to improve community safety     Time  8    Period  Weeks    Status  On-going      PT LONG TERM GOAL #5   Title  she ,with support, will be able to sit as needed for activity with 1-2/10 max pain    Baseline  1/10 today    Time  6    Period  Weeks    Status  On-going            Plan - 05/23/17 [id number]    Clinical Impression Statement  Therapy focused on the pts L side neck. Pt presented with increased neck pain secondary to a broken crown. Therapy used IASTM to left cervical paraspinals and upper trap in order  to release triggers points. Pt performed active stretches focused on the left side of the neck. Pt will be visiting the dentist after therapy to repair the crown.      Clinical Presentation  Evolving    Rehab Potential  Good    PT Frequency  2x / week    PT Duration  4 weeks    PT Treatment/Interventions  ADLs/Self Care Home Management;Cryotherapy;Electrical Stimulation;Therapeutic exercise;Therapeutic activities;Neuromuscular re-education;Patient/family education;Passive range of motion;Manual techniques;Taping;Dry needling;Moist Heat    PT Next Visit Plan  Continue to focus on whatever is her biggest problem . Scapular strengthening exercises with abdominal breathing. IASTMY for the neck and back; consider standing exercises.     PT  Home Exercise Plan  wand flexion; LTR ; single knee to chest with towel     Consulted and Agree with Plan of Care  Patient       Patient will benefit from skilled therapeutic intervention in order to improve the following deficits and impairments:  Abnormal gait, Pain, Decreased safety awareness, Decreased knowledge of precautions, Decreased endurance, Decreased range of motion, Increased muscle spasms, Postural dysfunction, Decreased mobility  Visit Diagnosis: Chronic left-sided low back pain with left-sided sciatica  Acute pain of left shoulder  Difficulty in walking, not elsewhere classified  Cervicalgia  Problem List There are no active problems to display for this patient.  Elisabeth Pigeon SPT During this treatment session, the therapist was present, participating in and directing the treatment  05/23/2017  Dessie Coma PT DPT  05/23/2017, 4:19 PM  [facility] [address] [place], [place], [address] Phone: [phone]   Fax:  [phone]  Name: Deborah Stein MRN: [id number] Date of Birth: 12/08/1940

## 2017-05-28 ENCOUNTER — Ambulatory Visit: Payer: Medicare Other | Admitting: Physical Therapy

## 2017-05-28 ENCOUNTER — Encounter: Payer: Self-pay | Admitting: Physical Therapy

## 2017-05-28 DIAGNOSIS — R262 Difficulty in walking, not elsewhere classified: Secondary | ICD-10-CM

## 2017-05-28 DIAGNOSIS — M25512 Pain in left shoulder: Secondary | ICD-10-CM

## 2017-05-28 DIAGNOSIS — M5442 Lumbago with sciatica, left side: Secondary | ICD-10-CM | POA: Diagnosis not present

## 2017-05-28 DIAGNOSIS — G8929 Other chronic pain: Secondary | ICD-10-CM

## 2017-05-28 DIAGNOSIS — M542 Cervicalgia: Secondary | ICD-10-CM

## 2017-05-28 NOTE — Therapy (Addendum)
[facility] Outpatient Rehabilitation [facility] [address] [place], [place], [address] Phone: [phone]   Fax:  [phone]  Physical Therapy Treatment  Patient Details  Name: Deborah Stein MRN: [id number] Date of Birth: 1940/05/15 Referring Provider: Dr Danae Chen    Encounter Date: 05/28/2017  PT End of Session - 05/28/17 0855    Visit Number  19    Number of Visits  22    Date for PT Re-Evaluation  06/04/17    Authorization Time Period  [facility] medicare     PT Start Time  0845    PT Stop Time  0927    PT Time Calculation (min)  42 min    Activity Tolerance  Patient tolerated treatment well    Behavior During Therapy  [facility] for tasks assessed/performed       Past Medical History:  Diagnosis Date  . Environmental allergies   . Hypercholesteremia   . Hypertension     Past Surgical History:  Procedure Laterality Date  . ABDOMINAL HYSTERECTOMY    . TONSILLECTOMY      There were no vitals filed for this visit.  Subjective Assessment - 05/28/17 0846    Subjective  Pt reports neck pain has been better since last visit. Pt reports pain is a 1/10. Pt reports that pain extends on both sides of her neck but it is worse on the left.    Limitations  Standing;Lifting    How long can you sit comfortably?  about 20 min before she starts feeling it     How long can you stand comfortably?  15-20 minutes before the pain gets too bad     How long can you walk comfortably?  can walk but does not fewel like she can walk too much     Patient Stated Goals  difficulty sitting for long periods of time     Pain Score  1     Pain Location  Back    Pain Orientation  Left    Pain Descriptors / Indicators  Constant    Pain Type  Acute pain    Pain Radiating Towards  going down the leg    Pain Onset  More than a month ago    Pain Frequency  Constant    Aggravating Factors   standing     Pain Relieving Factors  rest    Effect of Pain on Daily Activities  difficulty  performing daily tasks    Multiple Pain Sites  Yes    Pain Score  1    Pain Location  Neck    Pain Orientation  Left    Pain Descriptors / Indicators  Aching    Pain Type  Chronic pain    Pain Onset  More than a month ago    Pain Frequency  Constant    Aggravating Factors   use of the arm     Pain Relieving Factors  rest    Effect of Pain on Daily Activities  difficulty performing ADL's                No data recorded       [facility] Adult PT Treatment/Exercise - 05/28/17 0001      Neck Exercises: Standing   Other Standing Exercises  standing extension 2x10 red; shoulder retraction 2x10 red       Moist Heat Therapy   Number Minutes Moist Heat  10 Minutes    Moist Heat Location  Lumbar Spine;Cervical  Manual Therapy   Manual therapy comments  skilled palaption of trigger pointsin upper traps and lumbar spine     Soft tissue mobilization  trigger point release to left paraspinals. IASTYM to left cervical paraspinals      Neck Exercises: Stretches   Upper Trapezius Stretch  2 reps;20 seconds;Left    Levator Stretch  2 reps;20 seconds;Left       Trigger Point Dry Needling - 05/28/17 0930    Consent Given?  Yes    Education Handout Provided  No    Upper Trapezius Response  Twitch reponse elicited           PT Education - 05/28/17 0910    Education provided  Yes    Education Details  benefits and risks     Person(s) Educated  Patient    Methods  Explanation;Demonstration;Verbal cues;Tactile cues    Comprehension  Verbalized understanding;Returned demonstration;Verbal cues required;Tactile cues required       PT Short Term Goals - 05/23/17 1617      PT SHORT TERM GOAL #1   Title  Pt will be independent with inital HEP    Baseline  doing HEP 2 x per day    Time  4    Period  Weeks    Status  Achieved      PT SHORT TERM GOAL #2   Title  Patient will increase left hip strength and left shoulder strength to 4+/5     Baseline  4/5 hip flexion 4+/5  abduction     Time  4    Period  Weeks    Status  Achieved      PT SHORT TERM GOAL #3   Title  Patient will report centralized lower back pain without left radiculopathy    Baseline  No radiating back pain today     Time  4    Period  Weeks    Status  Achieved      PT SHORT TERM GOAL #4   Title  She will report no LT arm symptoms    Baseline  no left arm symptoms     Time  3    Period  Weeks    Status  Achieved        PT Long Term Goals - 05/02/17 1205      PT LONG TERM GOAL #1   Title  Patient will stand for 45 minutes without self report of pain in order to perfrom ADL's     Baseline  still limited     Time  8    Period  Weeks    Status  On-going      PT LONG TERM GOAL #2   Title  Patient will demostrate a 41% limitation on FOTO     Baseline  will assess soon     Time  8    Period  Weeks    Status  On-going      PT LONG TERM GOAL #3   Title  She will report no back pain with home activties and will able to clean bathroom without pain more than 1-2/10    Baseline  can do light activity     Time  8    Period  Weeks    Status  On-going      PT LONG TERM GOAL #4   Title  Patient will increase cervical rotation by 15 degrees in order to improve community safety     Time  8  Period  Weeks    Status  On-going      PT LONG TERM GOAL #5   Title  she ,with support, will be able to sit as needed for activity with 1-2/10 max pain    Baseline  1/10 today    Time  6    Period  Weeks    Status  On-going            Plan - 05/28/17 0855    Clinical Impression Statement  Therapy used skilled palapation in order to find 3 trigger points in the left upper trap and cervical paraspinals. Therapy proceeded with dry needling. Pt tolerated the dry needling well. She had a good towtich respose. Less spasming noted post treatment. Therapy reviewed stretching and light exercises.      Clinical Presentation  Evolving    Clinical Decision Making  Moderate    Rehab Potential   Good    PT Frequency  2x / week    PT Duration  4 weeks    PT Treatment/Interventions  ADLs/Self Care Home Management;Cryotherapy;Electrical Stimulation;Therapeutic exercise;Therapeutic activities;Neuromuscular re-education;Patient/family education;Passive range of motion;Manual techniques;Taping;Dry needling;Moist Heat    PT Next Visit Plan  Continue to focus on whatever is her biggest problem . Scapular strengthening exercises with abdominal breathing. IASTMY for the neck and back; consider standing exercises.     PT Home Exercise Plan  wand flexion; LTR ; single knee to chest with towel     Consulted and Agree with Plan of Care  Patient       Patient will benefit from skilled therapeutic intervention in order to improve the following deficits and impairments:  Abnormal gait, Pain, Decreased safety awareness, Decreased knowledge of precautions, Decreased endurance, Decreased range of motion, Increased muscle spasms, Postural dysfunction, Decreased mobility  Visit Diagnosis: Chronic left-sided low back pain with left-sided sciatica  Acute pain of left shoulder  Difficulty in walking, not elsewhere classified  Cervicalgia    During this treatment session, the therapist was present, participating in and directing the treatment.  Problem List There are no active problems to display for this patient.  Lorayne Bender  PT DPT  05/28/2017 12:41  Elisabeth Pigeon, SPT 05/28/2017, 12:41 PM  [facility] [address] [place], [place], [address] Phone: [phone]   Fax:  [phone]  Name: Deborah Stein MRN: [id number] Date of Birth: 30-Jun-1940

## 2017-05-30 ENCOUNTER — Ambulatory Visit: Payer: Medicare Other | Admitting: Physical Therapy

## 2017-05-30 DIAGNOSIS — G8929 Other chronic pain: Secondary | ICD-10-CM

## 2017-05-30 DIAGNOSIS — R262 Difficulty in walking, not elsewhere classified: Secondary | ICD-10-CM

## 2017-05-30 DIAGNOSIS — M542 Cervicalgia: Secondary | ICD-10-CM

## 2017-05-30 DIAGNOSIS — M5442 Lumbago with sciatica, left side: Secondary | ICD-10-CM | POA: Diagnosis not present

## 2017-05-30 DIAGNOSIS — M25512 Pain in left shoulder: Secondary | ICD-10-CM

## 2017-05-31 NOTE — Therapy (Addendum)
[facility] Outpatient Rehabilitation [facility] [address] [place], [place], [address] Phone: [phone]   Fax:  [phone]  Physical Therapy Treatment  Patient Details  Name: Deborah Stein MRN: [id number] Date of Birth: 1941/02/06 Referring Provider: Dr Danae Chen    Encounter Date: 05/30/2017  PT End of Session - 05/30/17 0851    Visit Number  20    Number of Visits  22    Date for PT Re-Evaluation  06/04/17    Authorization Time Period  [facility] medicare     PT Start Time  0848    PT Stop Time  0940    PT Time Calculation (min)  52 min    Activity Tolerance  Patient tolerated treatment well    Behavior During Therapy  [facility] for tasks assessed/performed       Past Medical History:  Diagnosis Date  . Environmental allergies   . Hypercholesteremia   . Hypertension     Past Surgical History:  Procedure Laterality Date  . ABDOMINAL HYSTERECTOMY    . TONSILLECTOMY      There were no vitals filed for this visit.  Subjective Assessment - 05/30/17 0856    Subjective  Pt reports neck pain has been better since last visit. Pt reports pain is a 1/10. Pt reports that pain extends on both sides of her neck but it is worse on the left. She is very furstrated at this time.     Limitations  Standing;Lifting    How long can you sit comfortably?  about 20 min before she starts feeling it     How long can you stand comfortably?  15-20 minutes before the pain gets too bad     How long can you walk comfortably?  can walk but does not fewel like she can walk too much     Patient Stated Goals  difficulty sitting for long periods of time     Currently in Pain?  Yes    Pain Score  4     Pain Location  Back    Pain Orientation  Left    Pain Descriptors / Indicators  Constant    Pain Type  Acute pain    Pain Radiating Towards  radiating down into her leg     Pain Onset  More than a month ago    Pain Frequency  Constant    Aggravating Factors   standing     Pain  Relieving Factors  rest    Multiple Pain Sites  Yes    Pain Score  1    Pain Location  Neck    Pain Orientation  Left    Pain Descriptors / Indicators  Aching    Pain Type  Chronic pain    Pain Onset  More than a month ago    Pain Frequency  Constant    Aggravating Factors   use of the arm     Pain Relieving Factors  rest     Effect of Pain on Daily Activities  difficulty perfroming ADL's          [facility] PT Assessment - 05/31/17 0001      AROM   Left Shoulder Flexion  135 Degrees minimal pain reported     Cervical Flexion  40    Cervical Extension  42    Cervical - Right Rotation  60    Cervical - Left Rotation  48      Strength   Left Shoulder Extension  4+/5    Left Shoulder Internal Rotation  4/5    Left Shoulder External Rotation  4+/5    Left Hip Flexion  4/5    Left Hip ABduction  4/5    Left Hip ADduction  4/5      Palpation   Palpation comment  spasming of upper trap; spasming of the lumbar spine into the lateral hip            No data recorded       [facility] Adult PT Treatment/Exercise - 05/31/17 0001      Lumbar Exercises: Stretches   Active Hamstring Stretch  3 reps;20 seconds    Lower Trunk Rotation Limitations  x10     Piriformis Stretch  2 reps;20 seconds      Moist Heat Therapy   Number Minutes Moist Heat  10 Minutes    Moist Heat Location  Lumbar Spine;Cervical      Manual Therapy   Manual Therapy  Soft tissue mobilization;Passive ROM    Manual therapy comments  skilled palaption of trigger pointsin upper traps and lumbar spine     Soft tissue mobilization  trigger point release to left paraspinals.     Manual Traction  IASTYM to lumbar spine        Trigger Point Dry Needling - 05/31/17 1002    Longissimus Response  -- lumbar paraspinals            PT Education - 05/31/17 0959    Education provided  Yes    Education Details  stretching technique. Improtance of continuing home exercises     Person(s) Educated  Patient     Methods  Explanation;Demonstration;Tactile cues;Verbal cues    Comprehension  Returned demonstration;Verbal cues required;Verbalized understanding;Tactile cues required       PT Short Term Goals - 05/31/17 1005      PT SHORT TERM GOAL #1   Title  Pt will be independent with inital HEP    Baseline  doing HEP 2 x per day    Time  4    Period  Weeks    Status  Achieved      PT SHORT TERM GOAL #2   Title  Patient will increase left hip strength and left shoulder strength to 4+/5     Baseline  4/5 hip flexion 4+/5 abduction     Time  4    Period  Weeks    Status  Achieved      PT SHORT TERM GOAL #3   Title  Patient will report centralized lower back pain without left radiculopathy    Baseline  pain bvack to radiating     Time  4    Period  Weeks    Status  On-going      PT SHORT TERM GOAL #4   Title  She will report no LT arm symptoms    Baseline  no left arm symptoms     Time  3    Period  Weeks    Status  Achieved        PT Long Term Goals - 05/02/17 1205      PT LONG TERM GOAL #1   Title  Patient will stand for 45 minutes without self report of pain in order to perfrom ADL's     Baseline  still limited     Time  8    Period  Weeks    Status  On-going      PT LONG  TERM GOAL #2   Title  Patient will demostrate a 41% limitation on FOTO     Baseline  will assess soon     Time  8    Period  Weeks    Status  On-going      PT LONG TERM GOAL #3   Title  She will report no back pain with home activties and will able to clean bathroom without pain more than 1-2/10    Baseline  can do light activity     Time  8    Period  Weeks    Status  On-going      PT LONG TERM GOAL #4   Title  Patient will increase cervical rotation by 15 degrees in order to improve community safety     Time  8    Period  Weeks    Status  On-going      PT LONG TERM GOAL #5   Title  she ,with support, will be able to sit as needed for activity with 1-2/10 max pain    Baseline  1/10 today     Time  6    Period  Weeks    Status  On-going            Plan - 05/31/17 1002    Clinical Impression Statement  Patient ois very frustrated. Her pain gets better then comes back. Therapy tried TPDN to patients klumbar spine today. She will be seen for a follow up to see how needling has helped. She will then be discharged to HEP. She was encouraged to continue with her stretching and exercises. Throughout the course of treatments she has had several times when her pain has decreased but it comes back. She is approaching max benefit from therapy, She had a good twitch respose to needling.     Clinical Presentation  Evolving    Clinical Decision Making  Moderate    Rehab Potential  Good    PT Frequency  2x / week    PT Duration  4 weeks    PT Treatment/Interventions  ADLs/Self Care Home Management;Cryotherapy;Electrical Stimulation;Therapeutic exercise;Therapeutic activities;Neuromuscular re-education;Patient/family education;Passive range of motion;Manual techniques;Taping;Dry needling;Moist Heat    PT Next Visit Plan  Continue to focus on whatever is her biggest problem . Scapular strengthening exercises with abdominal breathing. IASTMY for the neck and back; consider standing exercises.     PT Home Exercise Plan  wand flexion; LTR ; single knee to chest with towel     Consulted and Agree with Plan of Care  Patient       Patient will benefit from skilled therapeutic intervention in order to improve the following deficits and impairments:  Abnormal gait, Pain, Decreased safety awareness, Decreased knowledge of precautions, Decreased endurance, Decreased range of motion, Increased muscle spasms, Postural dysfunction, Decreased mobility  Visit Diagnosis: Chronic left-sided low back pain with left-sided sciatica  Acute pain of left shoulder  Difficulty in walking, not elsewhere classified  Cervicalgia     Problem List There are no active problems to display for this  patient.  Elisabeth Pigeonicole Bailey SPT  05/31/2017  During this treatment session, the therapist was present, participating in and directing the treatment.   Dessie Comaavid J Nusrat Encarnacion PT DPT  05/31/2017, 10:07 AM  [facility] [address] [place], [place], [address] Phone: [phone]   Fax:  [phone]  Name: Deborah Stein MRN: [id number] Date of Birth: 10-Mar-1940

## 2017-06-11 ENCOUNTER — Encounter: Payer: Self-pay | Admitting: Physical Therapy

## 2017-06-11 ENCOUNTER — Ambulatory Visit: Payer: Medicare Other | Attending: Internal Medicine | Admitting: Physical Therapy

## 2017-06-11 DIAGNOSIS — M5442 Lumbago with sciatica, left side: Secondary | ICD-10-CM | POA: Insufficient documentation

## 2017-06-11 DIAGNOSIS — M545 Low back pain, unspecified: Secondary | ICD-10-CM

## 2017-06-11 DIAGNOSIS — M79605 Pain in left leg: Secondary | ICD-10-CM | POA: Diagnosis present

## 2017-06-11 DIAGNOSIS — G8929 Other chronic pain: Secondary | ICD-10-CM | POA: Diagnosis present

## 2017-06-11 DIAGNOSIS — R262 Difficulty in walking, not elsewhere classified: Secondary | ICD-10-CM

## 2017-06-11 DIAGNOSIS — M25512 Pain in left shoulder: Secondary | ICD-10-CM | POA: Diagnosis present

## 2017-06-11 DIAGNOSIS — M542 Cervicalgia: Secondary | ICD-10-CM | POA: Insufficient documentation

## 2017-06-11 NOTE — Therapy (Signed)
[place], [place], [address] Phone: [phone]   Fax:  [phone]  Physical Therapy Treatment / Discharge  Patient Details  Name: Deborah Stein MRN: [id number] Date of Birth: [birth year] Referring Provider: Dr Jola Baptist    Encounter Date: 06/11/2017  PT End of Session - 06/11/17 0905    Visit Number  21    Number of Visits  22    Date for PT Re-Evaluation  06/04/17    Authorization Time Period  [employer] medicare     PT Start Time  0840    PT Stop Time  0930    PT Time Calculation (min)  50 min    Activity Tolerance  Patient tolerated treatment well    Behavior During Therapy  [facility] for tasks assessed/performed       Past Medical History:  Diagnosis Date  . Environmental allergies   . Hypercholesteremia   . Hypertension     Past Surgical History:  Procedure Laterality Date  . ABDOMINAL HYSTERECTOMY    . TONSILLECTOMY      There were no vitals filed for this visit.  Subjective Assessment - 06/11/17 0845    Subjective  Patient reports that her back and neck pain are feeling about the same today at a 1/10. Patient reports that she has been feeling better but wants to know if she will ever be pain free. Patient has been able to return to church and is excited about that.    Limitations  Standing;Lifting    How long can you sit comfortably?  about 20 min before she starts feeling it     How long can you stand comfortably?  15-20 minutes before the pain gets too bad     How long can you walk comfortably?  can walk but does not fewel like she can walk too much     Patient Stated Goals  difficulty sitting for long periods of time     Currently in Pain?  Yes    Pain Score  1     Pain Location  Back    Pain Orientation  Left    Pain Descriptors / Indicators  Constant    Pain Type  Acute pain    Pain Radiating Towards  radiating down into her leg     Pain Onset  More than a month ago    Pain  Frequency  Constant    Aggravating Factors   standing     Pain Relieving Factors  rest    Effect of Pain on Daily Activities  difficulty performing daily tasks    Multiple Pain Sites  Yes    Pain Score  1    Pain Location  Neck    Pain Orientation  Left    Pain Descriptors / Indicators  Aching    Pain Type  Chronic pain    Pain Onset  More than a month ago    Pain Frequency  Constant    Aggravating Factors   use of the arm     Pain Relieving Factors  rest     Effect of Pain on Daily Activities  difficulty performing ADL's         [facility] PT Assessment - 06/11/17 0001      AROM   Cervical - Right Rotation  65    Cervical - Left Rotation  65      Strength   Left Shoulder Extension  4+/5  Left Shoulder Internal Rotation  4/5    Left Shoulder External Rotation  4+/5    Left Hip Flexion  4+/5    Left Hip ABduction  4+/5    Left Hip ADduction  4+/5                   [facility] Adult PT Treatment/Exercise - 06/11/17 0001      Neck Exercises: Standing   Other Standing Exercises  standing extension 2x10 red; shoulder retraction 2x10 red       Lumbar Exercises: Stretches   Active Hamstring Stretch  3 reps;20 seconds    Lower Trunk Rotation Limitations  x10     Piriformis Stretch  2 reps;20 seconds      Lumbar Exercises: Supine   Ab Set  Limitations    AB Set Limitations  reviewed abdominal breathing     Clam Limitations  2x10 green     Bent Knee Raise Limitations  2x10     Bridge  Limitations    Bridge Limitations  2x10       Shoulder Exercises: Supine   Other Supine Exercises   horizontal abduction 10; Bilateral ER 10; red      Neck Exercises: Stretches   Upper Trapezius Stretch  2 reps;20 seconds;Left    Levator Stretch  2 reps;20 seconds;Left             PT Education - 06/11/17 1004    Education provided  Yes    Education Details  Associate Professor) Educated  Patient    Methods  Demonstration;Explanation;Tactile  cues;Verbal cues    Comprehension  Verbalized understanding;Returned demonstration;Verbal cues required;Tactile cues required       PT Short Term Goals - 06/11/17 1005      PT SHORT TERM GOAL #1   Title  Pt will be independent with inital HEP    Baseline  doing HEP 2 x per day    Time  4    Period  Weeks    Status  Achieved      PT SHORT TERM GOAL #2   Title  Patient will increase left hip strength and left shoulder strength to 4+/5     Baseline  4/5 hip flexion 4+/5 abduction     Time  4    Period  Weeks    Status  Achieved      PT SHORT TERM GOAL #3   Title  Patient will report centralized lower back pain without left radiculopathy    Baseline  no radiating pain at this time    Time  4    Period  Weeks    Status  Achieved      PT SHORT TERM GOAL #4   Title  She will report no LT arm symptoms    Baseline  no left arm symptoms     Time  3    Period  Weeks    Status  Achieved      PT SHORT TERM GOAL #5   Title  She will be able to   sit and stand for 20 min or more without increased pain    Baseline  sat at [employer] with just minor lower back pain     Time  3    Period  Weeks    Status  Achieved        PT Long Term Goals - 06/11/17 1006      PT LONG TERM GOAL #  1   Title  Patient will stand for 45 minutes without self report of pain in order to perfrom ADL's     Baseline  still limited     Time  8    Period  Weeks    Status  On-going      PT LONG TERM GOAL #2   Title  Patient will demostrate a 41% limitation on FOTO     Baseline  41% limitation     Time  8    Period  Weeks    Status  Achieved      PT LONG TERM GOAL #3   Title  She will report no back pain with home activties and will able to clean bathroom without pain more than 1-2/10    Baseline  can do light activity 1-2 out of ten pain     Time  8    Period  Weeks    Status  Achieved      PT LONG TERM GOAL #4   Title  Patient will increase cervical rotation by 15 degrees in order to improve  community safety     Baseline  full functional range bilateral     Time  8    Period  Weeks    Status  Achieved      PT LONG TERM GOAL #5   Title  she ,with support, will be able to sit as needed for activity with 1-2/10 max pain    Baseline  1/10 today    Time  6    Period  Weeks    Status  Achieved            Plan - 06/11/17 0909    Clinical Impression Statement  Patient continues to have pain that increases and decreases. She has very little pain today but on other days her pain can reach  4-5/10. Therapy had a long discussion with patient about mangement of chrocnic pain. At this point the patient has had 21 visits of PT. She has a complete home exercise program. She has reached max potential for PT.     Clinical Presentation  Evolving    Clinical Decision Making  Moderate    Rehab Potential  Good    PT Frequency  2x / week    PT Duration  4 weeks    PT Treatment/Interventions  ADLs/Self Care Home Management;Cryotherapy;Electrical Stimulation;Therapeutic exercise;Therapeutic activities;Neuromuscular re-education;Patient/family education;Passive range of motion;Manual techniques;Taping;Dry needling;Moist Heat    PT Next Visit Plan  D/C to HEP     PT Home Exercise Plan  wand flexion; LTR ; single knee to chest with towel     Consulted and Agree with Plan of Care  Patient       Patient will benefit from skilled therapeutic intervention in order to improve the following deficits and impairments:  Abnormal gait, Pain, Decreased safety awareness, Decreased knowledge of precautions, Decreased endurance, Decreased range of motion, Increased muscle spasms, Postural dysfunction, Decreased mobility  Visit Diagnosis: Chronic left-sided low back pain with left-sided sciatica  Acute pain of left shoulder  Difficulty in walking, not elsewhere classified  Cervicalgia  Acute bilateral low back pain without sciatica  Pain in left leg    PHYSICAL THERAPY DISCHARGE SUMMARY  Visits  from Start of Care: 21  Current functional level related to goals / functional outcomes: Improved pain. Is performing most ADL's and IADL's    Remaining deficits: Has pain at times    Education / Equipment: HEP  Plan: Patient agrees to discharge.  Patient goals were met. Patient is being discharged due to meeting the stated rehab goals.  ?????      Problem List There are no active problems to display for this patient.   Carney Living PT DPT  06/11/2017, 10:17 AM  Cooper Render SPT  06/11/2017   During this treatment session, the therapist was present, participating in and directing the treatment.  [place], [place], [address] Phone: [phone]   Fax:  [phone]  Name: ETNA FORQUER MRN: [id number] Date of Birth: 1940-09-24

## 2017-11-06 ENCOUNTER — Other Ambulatory Visit: Payer: Self-pay | Admitting: Obstetrics & Gynecology

## 2017-11-06 DIAGNOSIS — Z1231 Encounter for screening mammogram for malignant neoplasm of breast: Secondary | ICD-10-CM

## 2017-12-03 ENCOUNTER — Ambulatory Visit
Admission: RE | Admit: 2017-12-03 | Discharge: 2017-12-03 | Disposition: A | Discharge status: Home / Self Care | Payer: Medicare Other | Source: Ambulatory Visit | Attending: Obstetrics & Gynecology | Admitting: Obstetrics & Gynecology

## 2017-12-03 DIAGNOSIS — Z1231 Encounter for screening mammogram for malignant neoplasm of breast: Secondary | ICD-10-CM
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
## 2018-11-06 ENCOUNTER — Other Ambulatory Visit: Payer: Self-pay | Admitting: Obstetrics & Gynecology

## 2018-11-06 DIAGNOSIS — Z1231 Encounter for screening mammogram for malignant neoplasm of breast: Secondary | ICD-10-CM

## 2018-12-24 ENCOUNTER — Ambulatory Visit
Admission: RE | Admit: 2018-12-24 | Discharge: 2018-12-24 | Disposition: A | Discharge status: Home / Self Care | Payer: Medicare Other | Source: Ambulatory Visit | Attending: Obstetrics & Gynecology | Admitting: Obstetrics & Gynecology

## 2018-12-24 ENCOUNTER — Other Ambulatory Visit: Payer: Self-pay

## 2018-12-24 DIAGNOSIS — Z1231 Encounter for screening mammogram for malignant neoplasm of breast: Secondary | ICD-10-CM

## 2019-11-25 ENCOUNTER — Other Ambulatory Visit: Payer: Self-pay | Admitting: Obstetrics & Gynecology

## 2019-11-25 DIAGNOSIS — Z1231 Encounter for screening mammogram for malignant neoplasm of breast: Secondary | ICD-10-CM

## 2019-12-25 ENCOUNTER — Ambulatory Visit
Admission: RE | Admit: 2019-12-25 | Discharge: 2019-12-25 | Disposition: A | Discharge status: Home / Self Care | Payer: Medicare PPO | Source: Ambulatory Visit | Attending: Obstetrics & Gynecology | Admitting: Obstetrics & Gynecology

## 2019-12-25 ENCOUNTER — Other Ambulatory Visit: Payer: Self-pay

## 2019-12-25 DIAGNOSIS — Z1231 Encounter for screening mammogram for malignant neoplasm of breast: Secondary | ICD-10-CM

## 2020-01-01 DIAGNOSIS — I10 Essential (primary) hypertension: Secondary | ICD-10-CM | POA: Diagnosis not present

## 2020-01-01 DIAGNOSIS — Z23 Encounter for immunization: Secondary | ICD-10-CM | POA: Diagnosis not present

## 2020-01-01 DIAGNOSIS — M25512 Pain in left shoulder: Secondary | ICD-10-CM | POA: Diagnosis not present

## 2020-01-05 DIAGNOSIS — M67912 Unspecified disorder of synovium and tendon, left shoulder: Secondary | ICD-10-CM | POA: Diagnosis not present

## 2020-01-12 DIAGNOSIS — Z6826 Body mass index (BMI) 26.0-26.9, adult: Secondary | ICD-10-CM | POA: Diagnosis not present

## 2020-01-12 DIAGNOSIS — Z01419 Encounter for gynecological examination (general) (routine) without abnormal findings: Secondary | ICD-10-CM | POA: Diagnosis not present

## 2020-01-12 DIAGNOSIS — N959 Unspecified menopausal and perimenopausal disorder: Secondary | ICD-10-CM | POA: Diagnosis not present

## 2020-01-12 DIAGNOSIS — R319 Hematuria, unspecified: Secondary | ICD-10-CM | POA: Diagnosis not present

## 2020-02-11 DIAGNOSIS — M19012 Primary osteoarthritis, left shoulder: Secondary | ICD-10-CM | POA: Diagnosis not present

## 2020-03-31 DIAGNOSIS — M67912 Unspecified disorder of synovium and tendon, left shoulder: Secondary | ICD-10-CM | POA: Diagnosis not present

## 2020-04-15 DIAGNOSIS — M25512 Pain in left shoulder: Secondary | ICD-10-CM | POA: Diagnosis not present

## 2020-04-21 DIAGNOSIS — M19012 Primary osteoarthritis, left shoulder: Secondary | ICD-10-CM | POA: Diagnosis not present

## 2020-05-26 DIAGNOSIS — M19012 Primary osteoarthritis, left shoulder: Secondary | ICD-10-CM | POA: Diagnosis not present

## 2020-07-07 DIAGNOSIS — I10 Essential (primary) hypertension: Secondary | ICD-10-CM | POA: Diagnosis not present

## 2020-07-07 DIAGNOSIS — M25562 Pain in left knee: Secondary | ICD-10-CM | POA: Diagnosis not present

## 2020-07-07 DIAGNOSIS — Z1389 Encounter for screening for other disorder: Secondary | ICD-10-CM | POA: Diagnosis not present

## 2020-07-07 DIAGNOSIS — I471 Supraventricular tachycardia: Secondary | ICD-10-CM | POA: Diagnosis not present

## 2020-07-07 DIAGNOSIS — R8290 Unspecified abnormal findings in urine: Secondary | ICD-10-CM | POA: Diagnosis not present

## 2020-07-07 DIAGNOSIS — E7800 Pure hypercholesterolemia, unspecified: Secondary | ICD-10-CM | POA: Diagnosis not present

## 2020-07-07 DIAGNOSIS — Z23 Encounter for immunization: Secondary | ICD-10-CM | POA: Diagnosis not present

## 2020-07-07 DIAGNOSIS — Z0000 Encounter for general adult medical examination without abnormal findings: Secondary | ICD-10-CM | POA: Diagnosis not present

## 2020-09-01 DIAGNOSIS — H04123 Dry eye syndrome of bilateral lacrimal glands: Secondary | ICD-10-CM | POA: Diagnosis not present

## 2020-09-01 DIAGNOSIS — H25813 Combined forms of age-related cataract, bilateral: Secondary | ICD-10-CM | POA: Diagnosis not present

## 2020-10-01 DIAGNOSIS — S92512A Displaced fracture of proximal phalanx of left lesser toe(s), initial encounter for closed fracture: Secondary | ICD-10-CM | POA: Diagnosis not present

## 2020-10-01 DIAGNOSIS — W2209XA Striking against other stationary object, initial encounter: Secondary | ICD-10-CM | POA: Diagnosis not present

## 2020-10-05 DIAGNOSIS — S92515A Nondisplaced fracture of proximal phalanx of left lesser toe(s), initial encounter for closed fracture: Secondary | ICD-10-CM | POA: Diagnosis not present

## 2020-10-15 DIAGNOSIS — S92515A Nondisplaced fracture of proximal phalanx of left lesser toe(s), initial encounter for closed fracture: Secondary | ICD-10-CM | POA: Diagnosis not present

## 2020-11-15 DIAGNOSIS — S92515A Nondisplaced fracture of proximal phalanx of left lesser toe(s), initial encounter for closed fracture: Secondary | ICD-10-CM | POA: Diagnosis not present

## 2020-11-25 ENCOUNTER — Other Ambulatory Visit: Payer: Self-pay | Admitting: Obstetrics & Gynecology

## 2020-11-25 DIAGNOSIS — Z1231 Encounter for screening mammogram for malignant neoplasm of breast: Secondary | ICD-10-CM

## 2020-12-29 ENCOUNTER — Other Ambulatory Visit: Payer: Self-pay

## 2020-12-29 ENCOUNTER — Ambulatory Visit
Admission: RE | Admit: 2020-12-29 | Discharge: 2020-12-29 | Disposition: A | Discharge status: Home / Self Care | Payer: Medicare PPO | Source: Ambulatory Visit | Attending: Obstetrics & Gynecology | Admitting: Obstetrics & Gynecology

## 2020-12-29 DIAGNOSIS — Z1231 Encounter for screening mammogram for malignant neoplasm of breast: Secondary | ICD-10-CM

## 2021-04-19 DIAGNOSIS — Z01419 Encounter for gynecological examination (general) (routine) without abnormal findings: Secondary | ICD-10-CM | POA: Diagnosis not present

## 2021-04-19 DIAGNOSIS — R319 Hematuria, unspecified: Secondary | ICD-10-CM | POA: Diagnosis not present

## 2021-04-19 DIAGNOSIS — Z6825 Body mass index (BMI) 25.0-25.9, adult: Secondary | ICD-10-CM | POA: Diagnosis not present

## 2021-07-12 DIAGNOSIS — E7800 Pure hypercholesterolemia, unspecified: Secondary | ICD-10-CM | POA: Diagnosis not present

## 2021-07-12 DIAGNOSIS — Z0000 Encounter for general adult medical examination without abnormal findings: Secondary | ICD-10-CM | POA: Diagnosis not present

## 2021-07-12 DIAGNOSIS — F5104 Psychophysiologic insomnia: Secondary | ICD-10-CM | POA: Diagnosis not present

## 2021-07-12 DIAGNOSIS — I10 Essential (primary) hypertension: Secondary | ICD-10-CM | POA: Diagnosis not present

## 2021-07-12 DIAGNOSIS — Z1331 Encounter for screening for depression: Secondary | ICD-10-CM | POA: Diagnosis not present

## 2021-07-12 DIAGNOSIS — R4589 Other symptoms and signs involving emotional state: Secondary | ICD-10-CM | POA: Diagnosis not present

## 2021-07-12 DIAGNOSIS — M79604 Pain in right leg: Secondary | ICD-10-CM | POA: Diagnosis not present

## 2021-07-22 DIAGNOSIS — I10 Essential (primary) hypertension: Secondary | ICD-10-CM | POA: Diagnosis not present

## 2021-07-22 DIAGNOSIS — M5431 Sciatica, right side: Secondary | ICD-10-CM | POA: Diagnosis not present

## 2021-07-22 DIAGNOSIS — E7800 Pure hypercholesterolemia, unspecified: Secondary | ICD-10-CM | POA: Diagnosis not present

## 2021-08-03 DIAGNOSIS — M7071 Other bursitis of hip, right hip: Secondary | ICD-10-CM | POA: Diagnosis not present

## 2021-08-03 DIAGNOSIS — L309 Dermatitis, unspecified: Secondary | ICD-10-CM | POA: Diagnosis not present

## 2021-08-05 DIAGNOSIS — M25551 Pain in right hip: Secondary | ICD-10-CM | POA: Diagnosis not present

## 2021-08-11 DIAGNOSIS — S76311D Strain of muscle, fascia and tendon of the posterior muscle group at thigh level, right thigh, subsequent encounter: Secondary | ICD-10-CM | POA: Diagnosis not present

## 2021-08-16 DIAGNOSIS — S76311D Strain of muscle, fascia and tendon of the posterior muscle group at thigh level, right thigh, subsequent encounter: Secondary | ICD-10-CM | POA: Diagnosis not present

## 2021-08-22 DIAGNOSIS — S76311D Strain of muscle, fascia and tendon of the posterior muscle group at thigh level, right thigh, subsequent encounter: Secondary | ICD-10-CM | POA: Diagnosis not present

## 2021-08-25 DIAGNOSIS — S76311D Strain of muscle, fascia and tendon of the posterior muscle group at thigh level, right thigh, subsequent encounter: Secondary | ICD-10-CM | POA: Diagnosis not present

## 2021-08-29 DIAGNOSIS — S76311D Strain of muscle, fascia and tendon of the posterior muscle group at thigh level, right thigh, subsequent encounter: Secondary | ICD-10-CM | POA: Diagnosis not present

## 2021-09-01 DIAGNOSIS — S76311D Strain of muscle, fascia and tendon of the posterior muscle group at thigh level, right thigh, subsequent encounter: Secondary | ICD-10-CM | POA: Diagnosis not present

## 2021-09-02 DIAGNOSIS — I10 Essential (primary) hypertension: Secondary | ICD-10-CM | POA: Diagnosis not present

## 2021-09-02 DIAGNOSIS — M7071 Other bursitis of hip, right hip: Secondary | ICD-10-CM | POA: Diagnosis not present

## 2021-09-05 DIAGNOSIS — S76311D Strain of muscle, fascia and tendon of the posterior muscle group at thigh level, right thigh, subsequent encounter: Secondary | ICD-10-CM | POA: Diagnosis not present

## 2021-09-08 DIAGNOSIS — S76311D Strain of muscle, fascia and tendon of the posterior muscle group at thigh level, right thigh, subsequent encounter: Secondary | ICD-10-CM | POA: Diagnosis not present

## 2021-09-13 DIAGNOSIS — S76311D Strain of muscle, fascia and tendon of the posterior muscle group at thigh level, right thigh, subsequent encounter: Secondary | ICD-10-CM | POA: Diagnosis not present

## 2021-09-16 DIAGNOSIS — S76311D Strain of muscle, fascia and tendon of the posterior muscle group at thigh level, right thigh, subsequent encounter: Secondary | ICD-10-CM | POA: Diagnosis not present

## 2021-09-19 DIAGNOSIS — S76311D Strain of muscle, fascia and tendon of the posterior muscle group at thigh level, right thigh, subsequent encounter: Secondary | ICD-10-CM | POA: Diagnosis not present

## 2021-09-23 DIAGNOSIS — S76311D Strain of muscle, fascia and tendon of the posterior muscle group at thigh level, right thigh, subsequent encounter: Secondary | ICD-10-CM | POA: Diagnosis not present

## 2021-09-26 DIAGNOSIS — S76311D Strain of muscle, fascia and tendon of the posterior muscle group at thigh level, right thigh, subsequent encounter: Secondary | ICD-10-CM | POA: Diagnosis not present

## 2021-09-30 DIAGNOSIS — S76311D Strain of muscle, fascia and tendon of the posterior muscle group at thigh level, right thigh, subsequent encounter: Secondary | ICD-10-CM | POA: Diagnosis not present

## 2021-10-03 DIAGNOSIS — S76311D Strain of muscle, fascia and tendon of the posterior muscle group at thigh level, right thigh, subsequent encounter: Secondary | ICD-10-CM | POA: Diagnosis not present

## 2021-10-07 DIAGNOSIS — S76311D Strain of muscle, fascia and tendon of the posterior muscle group at thigh level, right thigh, subsequent encounter: Secondary | ICD-10-CM | POA: Diagnosis not present

## 2021-10-10 DIAGNOSIS — S76311D Strain of muscle, fascia and tendon of the posterior muscle group at thigh level, right thigh, subsequent encounter: Secondary | ICD-10-CM | POA: Diagnosis not present

## 2021-10-14 DIAGNOSIS — S76311D Strain of muscle, fascia and tendon of the posterior muscle group at thigh level, right thigh, subsequent encounter: Secondary | ICD-10-CM | POA: Diagnosis not present

## 2021-10-17 DIAGNOSIS — S76311D Strain of muscle, fascia and tendon of the posterior muscle group at thigh level, right thigh, subsequent encounter: Secondary | ICD-10-CM | POA: Diagnosis not present

## 2021-10-18 DIAGNOSIS — S76311D Strain of muscle, fascia and tendon of the posterior muscle group at thigh level, right thigh, subsequent encounter: Secondary | ICD-10-CM | POA: Diagnosis not present

## 2021-10-20 DIAGNOSIS — S76311D Strain of muscle, fascia and tendon of the posterior muscle group at thigh level, right thigh, subsequent encounter: Secondary | ICD-10-CM | POA: Diagnosis not present

## 2021-10-24 DIAGNOSIS — S76311D Strain of muscle, fascia and tendon of the posterior muscle group at thigh level, right thigh, subsequent encounter: Secondary | ICD-10-CM | POA: Diagnosis not present

## 2021-10-27 DIAGNOSIS — M25551 Pain in right hip: Secondary | ICD-10-CM | POA: Diagnosis not present

## 2021-10-27 DIAGNOSIS — S76311D Strain of muscle, fascia and tendon of the posterior muscle group at thigh level, right thigh, subsequent encounter: Secondary | ICD-10-CM | POA: Diagnosis not present

## 2021-10-31 DIAGNOSIS — S76311D Strain of muscle, fascia and tendon of the posterior muscle group at thigh level, right thigh, subsequent encounter: Secondary | ICD-10-CM | POA: Diagnosis not present

## 2021-11-04 DIAGNOSIS — S76311D Strain of muscle, fascia and tendon of the posterior muscle group at thigh level, right thigh, subsequent encounter: Secondary | ICD-10-CM | POA: Diagnosis not present

## 2021-11-08 DIAGNOSIS — S76311D Strain of muscle, fascia and tendon of the posterior muscle group at thigh level, right thigh, subsequent encounter: Secondary | ICD-10-CM | POA: Diagnosis not present

## 2021-11-10 DIAGNOSIS — S76311D Strain of muscle, fascia and tendon of the posterior muscle group at thigh level, right thigh, subsequent encounter: Secondary | ICD-10-CM | POA: Diagnosis not present

## 2021-11-14 DIAGNOSIS — S76311D Strain of muscle, fascia and tendon of the posterior muscle group at thigh level, right thigh, subsequent encounter: Secondary | ICD-10-CM | POA: Diagnosis not present

## 2021-11-17 DIAGNOSIS — S76311D Strain of muscle, fascia and tendon of the posterior muscle group at thigh level, right thigh, subsequent encounter: Secondary | ICD-10-CM | POA: Diagnosis not present

## 2021-11-30 ENCOUNTER — Other Ambulatory Visit: Payer: Self-pay | Admitting: Internal Medicine

## 2021-11-30 DIAGNOSIS — Z1231 Encounter for screening mammogram for malignant neoplasm of breast: Secondary | ICD-10-CM

## 2021-12-07 DIAGNOSIS — H25813 Combined forms of age-related cataract, bilateral: Secondary | ICD-10-CM | POA: Diagnosis not present

## 2021-12-07 DIAGNOSIS — H04123 Dry eye syndrome of bilateral lacrimal glands: Secondary | ICD-10-CM | POA: Diagnosis not present

## 2022-01-03 ENCOUNTER — Ambulatory Visit
Admission: RE | Admit: 2022-01-03 | Discharge: 2022-01-03 | Disposition: A | Discharge status: Home / Self Care | Payer: Medicare PPO | Source: Ambulatory Visit | Attending: Internal Medicine | Admitting: Internal Medicine

## 2022-01-03 DIAGNOSIS — Z1231 Encounter for screening mammogram for malignant neoplasm of breast: Secondary | ICD-10-CM | POA: Diagnosis not present

## 2022-01-09 DIAGNOSIS — Z23 Encounter for immunization: Secondary | ICD-10-CM | POA: Diagnosis not present

## 2022-01-09 DIAGNOSIS — E7800 Pure hypercholesterolemia, unspecified: Secondary | ICD-10-CM | POA: Diagnosis not present

## 2022-01-09 DIAGNOSIS — M5431 Sciatica, right side: Secondary | ICD-10-CM | POA: Diagnosis not present

## 2022-01-09 DIAGNOSIS — I10 Essential (primary) hypertension: Secondary | ICD-10-CM | POA: Diagnosis not present

## 2022-07-13 DIAGNOSIS — E7800 Pure hypercholesterolemia, unspecified: Secondary | ICD-10-CM | POA: Diagnosis not present

## 2022-07-13 DIAGNOSIS — G4700 Insomnia, unspecified: Secondary | ICD-10-CM | POA: Diagnosis not present

## 2022-07-13 DIAGNOSIS — M5430 Sciatica, unspecified side: Secondary | ICD-10-CM | POA: Diagnosis not present

## 2022-07-13 DIAGNOSIS — Z0000 Encounter for general adult medical examination without abnormal findings: Secondary | ICD-10-CM | POA: Diagnosis not present

## 2022-07-13 DIAGNOSIS — Z1331 Encounter for screening for depression: Secondary | ICD-10-CM | POA: Diagnosis not present

## 2022-07-13 DIAGNOSIS — I10 Essential (primary) hypertension: Secondary | ICD-10-CM | POA: Diagnosis not present

## 2022-10-31 DIAGNOSIS — M5441 Lumbago with sciatica, right side: Secondary | ICD-10-CM | POA: Diagnosis not present

## 2022-10-31 DIAGNOSIS — M545 Low back pain, unspecified: Secondary | ICD-10-CM | POA: Diagnosis not present

## 2022-11-02 DIAGNOSIS — M4726 Other spondylosis with radiculopathy, lumbar region: Secondary | ICD-10-CM | POA: Diagnosis not present

## 2022-11-10 DIAGNOSIS — M4726 Other spondylosis with radiculopathy, lumbar region: Secondary | ICD-10-CM | POA: Diagnosis not present

## 2022-11-13 DIAGNOSIS — M4726 Other spondylosis with radiculopathy, lumbar region: Secondary | ICD-10-CM | POA: Diagnosis not present

## 2022-11-16 DIAGNOSIS — M4726 Other spondylosis with radiculopathy, lumbar region: Secondary | ICD-10-CM | POA: Diagnosis not present

## 2022-11-20 DIAGNOSIS — M4726 Other spondylosis with radiculopathy, lumbar region: Secondary | ICD-10-CM | POA: Diagnosis not present

## 2022-11-23 DIAGNOSIS — S39012A Strain of muscle, fascia and tendon of lower back, initial encounter: Secondary | ICD-10-CM | POA: Diagnosis not present

## 2022-11-23 DIAGNOSIS — M4726 Other spondylosis with radiculopathy, lumbar region: Secondary | ICD-10-CM | POA: Diagnosis not present

## 2022-11-27 DIAGNOSIS — M4726 Other spondylosis with radiculopathy, lumbar region: Secondary | ICD-10-CM | POA: Diagnosis not present

## 2022-11-28 ENCOUNTER — Other Ambulatory Visit: Payer: Self-pay | Admitting: Internal Medicine

## 2022-11-28 DIAGNOSIS — Z1231 Encounter for screening mammogram for malignant neoplasm of breast: Secondary | ICD-10-CM

## 2022-11-30 DIAGNOSIS — M4726 Other spondylosis with radiculopathy, lumbar region: Secondary | ICD-10-CM | POA: Diagnosis not present

## 2022-12-05 DIAGNOSIS — M4726 Other spondylosis with radiculopathy, lumbar region: Secondary | ICD-10-CM | POA: Diagnosis not present

## 2022-12-07 DIAGNOSIS — M4726 Other spondylosis with radiculopathy, lumbar region: Secondary | ICD-10-CM | POA: Diagnosis not present

## 2022-12-13 DIAGNOSIS — M4726 Other spondylosis with radiculopathy, lumbar region: Secondary | ICD-10-CM | POA: Diagnosis not present

## 2022-12-15 DIAGNOSIS — M4726 Other spondylosis with radiculopathy, lumbar region: Secondary | ICD-10-CM | POA: Diagnosis not present

## 2022-12-16 DIAGNOSIS — M545 Low back pain, unspecified: Secondary | ICD-10-CM | POA: Diagnosis not present

## 2022-12-19 DIAGNOSIS — M4726 Other spondylosis with radiculopathy, lumbar region: Secondary | ICD-10-CM | POA: Diagnosis not present

## 2022-12-21 DIAGNOSIS — M4726 Other spondylosis with radiculopathy, lumbar region: Secondary | ICD-10-CM | POA: Diagnosis not present

## 2022-12-26 DIAGNOSIS — M4726 Other spondylosis with radiculopathy, lumbar region: Secondary | ICD-10-CM | POA: Diagnosis not present

## 2022-12-28 DIAGNOSIS — M4726 Other spondylosis with radiculopathy, lumbar region: Secondary | ICD-10-CM | POA: Diagnosis not present

## 2023-01-02 DIAGNOSIS — M4726 Other spondylosis with radiculopathy, lumbar region: Secondary | ICD-10-CM | POA: Diagnosis not present

## 2023-01-04 DIAGNOSIS — M5441 Lumbago with sciatica, right side: Secondary | ICD-10-CM | POA: Diagnosis not present

## 2023-01-04 DIAGNOSIS — M4726 Other spondylosis with radiculopathy, lumbar region: Secondary | ICD-10-CM | POA: Diagnosis not present

## 2023-01-08 DIAGNOSIS — M4726 Other spondylosis with radiculopathy, lumbar region: Secondary | ICD-10-CM | POA: Diagnosis not present

## 2023-01-09 ENCOUNTER — Ambulatory Visit
Admission: RE | Admit: 2023-01-09 | Discharge: 2023-01-09 | Disposition: A | Discharge status: Home / Self Care | Payer: Medicare PPO | Source: Ambulatory Visit | Attending: Internal Medicine | Admitting: Internal Medicine

## 2023-01-09 DIAGNOSIS — Z1231 Encounter for screening mammogram for malignant neoplasm of breast: Secondary | ICD-10-CM

## 2023-01-11 ENCOUNTER — Encounter: Payer: Self-pay | Admitting: Family Medicine

## 2023-01-11 ENCOUNTER — Other Ambulatory Visit: Payer: Self-pay | Admitting: Family Medicine

## 2023-01-11 DIAGNOSIS — I10 Essential (primary) hypertension: Secondary | ICD-10-CM | POA: Diagnosis not present

## 2023-01-11 DIAGNOSIS — E7800 Pure hypercholesterolemia, unspecified: Secondary | ICD-10-CM | POA: Diagnosis not present

## 2023-01-11 DIAGNOSIS — M5431 Sciatica, right side: Secondary | ICD-10-CM | POA: Diagnosis not present

## 2023-01-11 DIAGNOSIS — M545 Low back pain, unspecified: Secondary | ICD-10-CM

## 2023-01-11 DIAGNOSIS — Z23 Encounter for immunization: Secondary | ICD-10-CM | POA: Diagnosis not present

## 2023-01-11 DIAGNOSIS — I471 Supraventricular tachycardia, unspecified: Secondary | ICD-10-CM | POA: Diagnosis not present

## 2023-01-11 DIAGNOSIS — G4700 Insomnia, unspecified: Secondary | ICD-10-CM | POA: Diagnosis not present

## 2023-01-11 DIAGNOSIS — R319 Hematuria, unspecified: Secondary | ICD-10-CM | POA: Diagnosis not present

## 2023-01-11 DIAGNOSIS — Z5181 Encounter for therapeutic drug level monitoring: Secondary | ICD-10-CM | POA: Diagnosis not present

## 2023-01-12 DIAGNOSIS — M4726 Other spondylosis with radiculopathy, lumbar region: Secondary | ICD-10-CM | POA: Diagnosis not present

## 2023-01-15 DIAGNOSIS — M4726 Other spondylosis with radiculopathy, lumbar region: Secondary | ICD-10-CM | POA: Diagnosis not present

## 2023-01-17 DIAGNOSIS — M4726 Other spondylosis with radiculopathy, lumbar region: Secondary | ICD-10-CM | POA: Diagnosis not present

## 2023-01-17 NOTE — Discharge Instructions (Signed)

## 2023-01-18 ENCOUNTER — Ambulatory Visit
Admission: RE | Admit: 2023-01-18 | Discharge: 2023-01-18 | Disposition: A | Discharge status: Home / Self Care | Payer: Medicare PPO | Source: Ambulatory Visit | Attending: Family Medicine | Admitting: Family Medicine

## 2023-01-18 DIAGNOSIS — M545 Low back pain, unspecified: Secondary | ICD-10-CM

## 2023-01-18 DIAGNOSIS — M4727 Other spondylosis with radiculopathy, lumbosacral region: Secondary | ICD-10-CM | POA: Diagnosis not present

## 2023-01-18 DIAGNOSIS — M4807 Spinal stenosis, lumbosacral region: Secondary | ICD-10-CM | POA: Diagnosis not present

## 2023-01-18 DIAGNOSIS — M48061 Spinal stenosis, lumbar region without neurogenic claudication: Secondary | ICD-10-CM | POA: Diagnosis not present

## 2023-01-18 MED ORDER — IOPAMIDOL (ISOVUE-M 200) INJECTION 41%
1.0000 mL | Freq: Once | INTRAMUSCULAR | Status: AC
  Administered 2023-01-18: 1 mL via EPIDURAL

## 2023-01-18 MED ORDER — METHYLPREDNISOLONE ACETATE 40 MG/ML INJ SUSP (RADIOLOG
80.0000 mg | Freq: Once | INTRAMUSCULAR | Status: AC
  Administered 2023-01-18: 80 mg via EPIDURAL

## 2023-01-23 DIAGNOSIS — M4726 Other spondylosis with radiculopathy, lumbar region: Secondary | ICD-10-CM | POA: Diagnosis not present

## 2023-01-25 DIAGNOSIS — M4726 Other spondylosis with radiculopathy, lumbar region: Secondary | ICD-10-CM | POA: Diagnosis not present

## 2023-01-29 DIAGNOSIS — M4726 Other spondylosis with radiculopathy, lumbar region: Secondary | ICD-10-CM | POA: Diagnosis not present

## 2023-01-31 DIAGNOSIS — M4726 Other spondylosis with radiculopathy, lumbar region: Secondary | ICD-10-CM | POA: Diagnosis not present

## 2023-02-05 DIAGNOSIS — M5441 Lumbago with sciatica, right side: Secondary | ICD-10-CM | POA: Diagnosis not present

## 2023-02-05 DIAGNOSIS — M4726 Other spondylosis with radiculopathy, lumbar region: Secondary | ICD-10-CM | POA: Diagnosis not present

## 2023-02-09 DIAGNOSIS — M4726 Other spondylosis with radiculopathy, lumbar region: Secondary | ICD-10-CM | POA: Diagnosis not present

## 2023-02-14 DIAGNOSIS — M4726 Other spondylosis with radiculopathy, lumbar region: Secondary | ICD-10-CM | POA: Diagnosis not present

## 2023-02-19 DIAGNOSIS — M4726 Other spondylosis with radiculopathy, lumbar region: Secondary | ICD-10-CM | POA: Diagnosis not present

## 2023-02-22 DIAGNOSIS — M4726 Other spondylosis with radiculopathy, lumbar region: Secondary | ICD-10-CM | POA: Diagnosis not present

## 2023-02-23 ENCOUNTER — Other Ambulatory Visit: Payer: Self-pay | Admitting: Family Medicine

## 2023-02-23 DIAGNOSIS — M545 Low back pain, unspecified: Secondary | ICD-10-CM

## 2023-03-09 NOTE — Discharge Instructions (Signed)
 Post Procedure Spinal Discharge Instruction Sheet  You may resume a regular diet and any medications that you routinely take (including pain medications) unless otherwise noted by MD.  No driving day of procedure.  Light activity throughout the rest of the day.  Do not do any strenuous work, exercise, bending or lifting.  The day following the procedure, you can resume normal physical activity but you should refrain from exercising or physical therapy for at least three days thereafter.  You may apply ice to the injection site, 20 minutes on, 20 minutes off, as needed. Do not apply ice directly to skin.    Common Side Effects:  Headaches- take your usual medications as directed by your physician.  Increase your fluid intake.  Caffeinated beverages may be helpful.  Lie flat in bed until your headache resolves.  Restlessness or inability to sleep- you may have trouble sleeping for the next few days.  Ask your referring physician if you need any medication for sleep.  Facial flushing or redness- should subside within a few days.  Increased pain- a temporary increase in pain a day or two following your procedure is not unusual.  Take your pain medication as prescribed by your referring physician.  Leg cramps  Please contact our office at [phone] for the following symptoms: Fever greater than 100 degrees. Headaches unresolved with medication after 2-3 days. Increased swelling, pain, or redness at injection site.   Thank you for visiting [facility] today.   YOU MAY RESUME YOUR ASPIRIN ANYTIME AFTER PROCEDURE TODAY

## 2023-03-12 ENCOUNTER — Ambulatory Visit
Admission: RE | Admit: 2023-03-12 | Discharge: 2023-03-12 | Disposition: A | Discharge status: Home / Self Care | Payer: Medicare PPO | Source: Ambulatory Visit | Attending: Family Medicine | Admitting: Family Medicine

## 2023-03-12 ENCOUNTER — Other Ambulatory Visit: Payer: Self-pay | Admitting: Family Medicine

## 2023-03-12 DIAGNOSIS — M4727 Other spondylosis with radiculopathy, lumbosacral region: Secondary | ICD-10-CM | POA: Diagnosis not present

## 2023-03-12 DIAGNOSIS — M545 Low back pain, unspecified: Secondary | ICD-10-CM

## 2023-03-12 MED ORDER — IOPAMIDOL (ISOVUE-M 200) INJECTION 41%: 1.0000 mL | Freq: Once | INTRAMUSCULAR | Status: DC

## 2023-03-12 MED ORDER — METHYLPREDNISOLONE ACETATE 40 MG/ML INJ SUSP (RADIOLOG: 80.0000 mg | Freq: Once | INTRAMUSCULAR | Status: DC

## 2023-04-30 DIAGNOSIS — J069 Acute upper respiratory infection, unspecified: Secondary | ICD-10-CM | POA: Diagnosis not present

## 2023-04-30 DIAGNOSIS — R052 Subacute cough: Secondary | ICD-10-CM | POA: Diagnosis not present

## 2023-05-07 DIAGNOSIS — R059 Cough, unspecified: Secondary | ICD-10-CM | POA: Diagnosis not present

## 2023-06-27 DIAGNOSIS — M545 Low back pain, unspecified: Secondary | ICD-10-CM | POA: Diagnosis not present

## 2023-06-27 DIAGNOSIS — M5441 Lumbago with sciatica, right side: Secondary | ICD-10-CM | POA: Diagnosis not present

## 2023-06-27 DIAGNOSIS — S39012A Strain of muscle, fascia and tendon of lower back, initial encounter: Secondary | ICD-10-CM | POA: Diagnosis not present

## 2023-08-01 DIAGNOSIS — H52223 Regular astigmatism, bilateral: Secondary | ICD-10-CM | POA: Diagnosis not present

## 2023-08-01 DIAGNOSIS — H524 Presbyopia: Secondary | ICD-10-CM | POA: Diagnosis not present

## 2023-08-15 DIAGNOSIS — H2513 Age-related nuclear cataract, bilateral: Secondary | ICD-10-CM | POA: Diagnosis not present

## 2023-08-31 DIAGNOSIS — I10 Essential (primary) hypertension: Secondary | ICD-10-CM | POA: Diagnosis not present

## 2023-08-31 DIAGNOSIS — H2511 Age-related nuclear cataract, right eye: Secondary | ICD-10-CM | POA: Diagnosis not present

## 2023-09-10 DIAGNOSIS — E2839 Other primary ovarian failure: Secondary | ICD-10-CM | POA: Diagnosis not present

## 2023-09-10 DIAGNOSIS — Z1331 Encounter for screening for depression: Secondary | ICD-10-CM | POA: Diagnosis not present

## 2023-09-10 DIAGNOSIS — I10 Essential (primary) hypertension: Secondary | ICD-10-CM | POA: Diagnosis not present

## 2023-09-10 DIAGNOSIS — Z5181 Encounter for therapeutic drug level monitoring: Secondary | ICD-10-CM | POA: Diagnosis not present

## 2023-09-10 DIAGNOSIS — M545 Low back pain, unspecified: Secondary | ICD-10-CM | POA: Diagnosis not present

## 2023-09-10 DIAGNOSIS — E7800 Pure hypercholesterolemia, unspecified: Secondary | ICD-10-CM | POA: Diagnosis not present

## 2023-09-10 DIAGNOSIS — Z0000 Encounter for general adult medical examination without abnormal findings: Secondary | ICD-10-CM | POA: Diagnosis not present

## 2023-09-10 DIAGNOSIS — G4700 Insomnia, unspecified: Secondary | ICD-10-CM | POA: Diagnosis not present

## 2023-09-11 ENCOUNTER — Other Ambulatory Visit (HOSPITAL_BASED_OUTPATIENT_CLINIC_OR_DEPARTMENT_OTHER): Payer: Self-pay | Admitting: Internal Medicine

## 2023-09-11 DIAGNOSIS — Z1211 Encounter for screening for malignant neoplasm of colon: Secondary | ICD-10-CM | POA: Diagnosis not present

## 2023-09-11 DIAGNOSIS — E2839 Other primary ovarian failure: Secondary | ICD-10-CM

## 2023-09-14 DIAGNOSIS — H2512 Age-related nuclear cataract, left eye: Secondary | ICD-10-CM | POA: Diagnosis not present

## 2023-09-14 DIAGNOSIS — F418 Other specified anxiety disorders: Secondary | ICD-10-CM | POA: Diagnosis not present

## 2023-12-19 ENCOUNTER — Other Ambulatory Visit: Payer: Self-pay | Admitting: Internal Medicine

## 2023-12-19 DIAGNOSIS — Z1231 Encounter for screening mammogram for malignant neoplasm of breast: Secondary | ICD-10-CM

## 2024-01-14 DIAGNOSIS — H04123 Dry eye syndrome of bilateral lacrimal glands: Secondary | ICD-10-CM | POA: Diagnosis not present

## 2024-01-24 ENCOUNTER — Ambulatory Visit
Admission: RE | Admit: 2024-01-24 | Discharge: 2024-01-24 | Disposition: A | Source: Ambulatory Visit | Attending: Internal Medicine | Admitting: Internal Medicine

## 2024-01-24 DIAGNOSIS — Z1231 Encounter for screening mammogram for malignant neoplasm of breast: Secondary | ICD-10-CM | POA: Diagnosis not present
